# Patient Record
Sex: Female | Born: 1970 | Race: Black or African American | Hispanic: No | Marital: Married | Smoking: Never smoker
Health system: Southern US, Community
[De-identification: ages and names within clinical notes are randomized; demographics above are authoritative.]

## PROBLEM LIST (undated history)

## (undated) ENCOUNTER — Emergency Department (HOSPITAL_COMMUNITY): Admission: EM | Discharge status: Against Medical Advice | Payer: No Typology Code available for payment source

## (undated) DIAGNOSIS — Z789 Other specified health status: Secondary | ICD-10-CM

## (undated) DIAGNOSIS — J302 Other seasonal allergic rhinitis: Secondary | ICD-10-CM

## (undated) DIAGNOSIS — J189 Pneumonia, unspecified organism: Secondary | ICD-10-CM

## (undated) DIAGNOSIS — I10 Essential (primary) hypertension: Secondary | ICD-10-CM

## (undated) DIAGNOSIS — J329 Chronic sinusitis, unspecified: Secondary | ICD-10-CM

## (undated) HISTORY — PX: TUBAL LIGATION: SHX77

---

## 1997-07-23 ENCOUNTER — Other Ambulatory Visit: Admission: RE | Admit: 1997-07-23 | Discharge: 1997-07-23 | Payer: Self-pay | Admitting: Obstetrics and Gynecology

## 1999-04-11 ENCOUNTER — Emergency Department (HOSPITAL_COMMUNITY): Admission: EM | Admit: 1999-04-11 | Discharge: 1999-04-11 | Payer: Self-pay | Admitting: Emergency Medicine

## 1999-04-11 ENCOUNTER — Encounter: Payer: Self-pay | Admitting: Emergency Medicine

## 1999-06-29 ENCOUNTER — Emergency Department (HOSPITAL_COMMUNITY): Admission: EM | Admit: 1999-06-29 | Discharge: 1999-06-29 | Payer: Self-pay | Admitting: Emergency Medicine

## 1999-11-14 ENCOUNTER — Other Ambulatory Visit: Admission: RE | Admit: 1999-11-14 | Discharge: 1999-11-14 | Payer: Self-pay | Admitting: Obstetrics

## 1999-12-15 ENCOUNTER — Encounter (INDEPENDENT_AMBULATORY_CARE_PROVIDER_SITE_OTHER): Payer: Self-pay

## 1999-12-15 ENCOUNTER — Inpatient Hospital Stay (HOSPITAL_COMMUNITY): Admission: AD | Admit: 1999-12-15 | Discharge: 1999-12-18 | Payer: Self-pay | Admitting: Obstetrics

## 2004-08-23 ENCOUNTER — Emergency Department (HOSPITAL_COMMUNITY): Admission: EM | Admit: 2004-08-23 | Discharge: 2004-08-23 | Payer: Self-pay | Admitting: *Deleted

## 2008-11-10 ENCOUNTER — Emergency Department (HOSPITAL_COMMUNITY): Admission: EM | Admit: 2008-11-10 | Discharge: 2008-11-10 | Payer: Self-pay | Admitting: Emergency Medicine

## 2009-11-12 ENCOUNTER — Emergency Department (HOSPITAL_COMMUNITY): Admission: EM | Admit: 2009-11-12 | Discharge: 2009-11-12 | Payer: Self-pay | Admitting: Emergency Medicine

## 2010-01-18 ENCOUNTER — Ambulatory Visit (HOSPITAL_COMMUNITY): Admission: RE | Admit: 2010-01-18 | Discharge: 2010-01-18 | Payer: Self-pay | Admitting: Obstetrics

## 2010-01-24 ENCOUNTER — Encounter: Admission: RE | Admit: 2010-01-24 | Discharge: 2010-01-24 | Payer: Self-pay | Admitting: Obstetrics

## 2010-02-02 ENCOUNTER — Inpatient Hospital Stay (HOSPITAL_COMMUNITY): Admission: EM | Admit: 2010-02-02 | Discharge: 2010-02-05 | Payer: Self-pay | Admitting: Emergency Medicine

## 2010-02-02 DIAGNOSIS — Z789 Other specified health status: Secondary | ICD-10-CM

## 2010-02-02 HISTORY — DX: Other specified health status: Z78.9

## 2010-02-07 ENCOUNTER — Ambulatory Visit: Payer: Self-pay | Admitting: Family Medicine

## 2010-02-11 ENCOUNTER — Ambulatory Visit: Payer: Self-pay | Admitting: Family Medicine

## 2010-02-16 ENCOUNTER — Ambulatory Visit: Payer: Self-pay | Admitting: Family Medicine

## 2010-02-18 ENCOUNTER — Ambulatory Visit: Payer: Self-pay | Admitting: Family Medicine

## 2010-02-22 ENCOUNTER — Ambulatory Visit: Payer: Self-pay | Admitting: Family Medicine

## 2010-02-28 ENCOUNTER — Ambulatory Visit: Payer: Self-pay | Admitting: Family Medicine

## 2010-03-07 ENCOUNTER — Ambulatory Visit: Payer: Self-pay | Admitting: Family Medicine

## 2010-03-21 ENCOUNTER — Ambulatory Visit: Payer: Self-pay | Admitting: Family Medicine

## 2010-04-06 ENCOUNTER — Ambulatory Visit: Payer: Self-pay | Admitting: Family Medicine

## 2010-04-21 ENCOUNTER — Ambulatory Visit: Payer: Self-pay | Admitting: Family Medicine

## 2010-05-23 ENCOUNTER — Ambulatory Visit
Admission: RE | Admit: 2010-05-23 | Discharge: 2010-05-23 | Payer: Self-pay | Source: Home / Self Care | Attending: Family Medicine | Admitting: Family Medicine

## 2010-07-12 ENCOUNTER — Ambulatory Visit: Payer: Self-pay | Admitting: Gynecology

## 2010-07-14 LAB — HEPARIN LEVEL (UNFRACTIONATED)
Heparin Unfractionated: 0.3 IU/mL (ref 0.30–0.70)
Heparin Unfractionated: 0.33 IU/mL (ref 0.30–0.70)
Heparin Unfractionated: 0.45 IU/mL (ref 0.30–0.70)

## 2010-07-14 LAB — BASIC METABOLIC PANEL
Chloride: 105 mEq/L (ref 96–112)
Chloride: 106 mEq/L (ref 96–112)
Creatinine, Ser: 0.76 mg/dL (ref 0.4–1.2)
Creatinine, Ser: 0.81 mg/dL (ref 0.4–1.2)
GFR calc non Af Amer: >60 mL/min (ref >60)
Sodium: 136 mEq/L (ref 135–145)

## 2010-07-14 LAB — CBC
HCT: 26.8 % — ABNORMAL LOW (ref 36.0–46.0)
HCT: 29.2 % — ABNORMAL LOW (ref 36.0–46.0)
HCT: 29.8 % — ABNORMAL LOW (ref 36.0–46.0)
MCH: 27.7 pg (ref 26.0–34.0)
MCH: 28.2 pg (ref 26.0–34.0)
MCHC: 33.3 g/dL (ref 30.0–36.0)
MCV: 84.2 fL (ref 78.0–100.0)
MCV: 84.9 fL (ref 78.0–100.0)
MCV: 85.7 fL (ref 78.0–100.0)
Platelets: 152 10*3/uL (ref 150–400)
RBC: 3.47 MIL/uL — ABNORMAL LOW (ref 3.87–5.11)
RDW: 14.6 % (ref 11.5–15.5)
RDW: 15 % (ref 11.5–15.5)

## 2010-07-14 LAB — DIFFERENTIAL
Basophils Relative: 0 % (ref 0–1)
Eosinophils Absolute: 0 10*3/uL (ref 0.0–0.7)
Eosinophils Relative: 1 % (ref 0–5)
Lymphs Abs: 1.6 10*3/uL (ref 0.7–4.0)
Monocytes Relative: 7 % (ref 3–12)
Neutro Abs: 4.2 10*3/uL (ref 1.7–7.7)
Neutrophils Relative %: 67 % (ref 43–77)

## 2010-07-14 LAB — PROTIME-INR
INR: 1.08 (ref 0.00–1.49)
INR: 1.12 (ref 0.00–1.49)
INR: 1.27 (ref 0.00–1.49)
Prothrombin Time: 14.6 seconds (ref 11.6–15.2)
Prothrombin Time: 16.1 seconds — ABNORMAL HIGH (ref 11.6–15.2)
Prothrombin Time: 21.4 seconds — ABNORMAL HIGH (ref 11.6–15.2)

## 2010-07-14 LAB — APTT: aPTT: 62 seconds — ABNORMAL HIGH (ref 24–37)

## 2010-07-14 LAB — POCT I-STAT, CHEM 8
Calcium, Ion: 1.15 mmol/L (ref 1.12–1.32)
Creatinine, Ser: 0.8 mg/dL (ref 0.4–1.2)
Glucose, Bld: 81 mg/dL (ref 70–99)

## 2010-08-30 HISTORY — PX: ENDOMETRIAL ABLATION W/ NOVASURE: SUR434

## 2010-09-16 NOTE — Op Note (Signed)
[facility] [place]  Patient:    Monica Christensen, Monica Christensen                           MRN: [id number] Proc. Date: 12/15/99 Adm. Date:  [id number] Attending:  Venita Sheffield                           Operative Report  PREOPERATIVE DIAGNOSES:       Intrauterine pregnancy at term, previous cesarean section and patient desires repeat cesarean section and tubal ligation.  SURGEON:                      Kathreen Cosier, M.D.  FIRST ASSISTANT:              Deniece Ree, M.D.  ANESTHESIA:                   Spinal.  DESCRIPTION OF PROCEDURE:     The patient was placed on the operating table in a supine position.  Abdomen prepped and draped.  Bladder emptied with Foley catheter.  A transverse suprapubic incision made through the old scar and carried down to the rectus fascia.  The fascia was cleanly incised the length of the incision.  The rectus muscles were retracted laterally and peritoneum incised longitudinally.  A transverse incision made in the visceral peritoneum above the bladder and the bladder mobilized.  A transverse low uterine incision made.  The patient was delivered from the OP position of a female, Apgars 8 and 9, weighing 6 pounds 2 ounces.  The throat was cleared.  A team of attendants over.  Placenta was posterior and removed manually.  The uterine cavity cleaned with dry laps.  Uterine incision closed with interlocking suture of #1 chromic including myometrium and endometrium.  The bladder flap reattached with 2-0 chromic.  Hemostasis was satisfactory.  The right tube was grasped in mid portion with Babcock clamp and #0 plain suture placed in the mesosalpinx below the portion of the tube in the clamp.  This was tied on both sides and approximately one inch of tube transected.  Procedure done in a similar fashion on the other side.  Lap and sponge counts correct.  Abdomen closed in layers, peritoneum continuous with #2-0 chromic, fascia  continuous with #00 Dexon, and the skin closed with subcuticular stitch of 3-0 plain. The patient tolerated procedure well and was taken to recovery room in good condition. DD:  12/15/99 TD:  12/16/99 Job: [id number]

## 2010-09-16 NOTE — Discharge Summary (Signed)
[facility] [place]  Patient:    Monica Christensen, Monica Christensen                           MRN: [id number] Adm. Date:  [id number] Disc. Date: [id number] Attending:  Venita Sheffield                           Discharge Summary  HISTORY OF PRESENT ILLNESS:   The patient is a 40 year old gravida 3, para 0-2-0-2, [facility] December 18, 1999.  She had previous cesarean section and desired repeat at term.  She also desired sterilization.  HOSPITAL COURSE:              She underwent repeat low transverse cesarean section and delivered a 6 pound 12 ounce female, Apgars 8 and 9.  She also had tubal ligation performed.  Her hemoglobin was 10.5.  Postoperatively, her hemoglobin was 9.5.  She had brief period of temperature elevation of 101, and she was treated with Cleocin IV.  She rapidly defervesced and was discharged home on the third postoperative day.  DISCHARGE INSTRUCTIONS:       Regular diet.  Tylox one to two every three to four hours p.r.n. and Cleocin 300 mg p.o. q.6h. x 5 days.  DISCHARGE DIAGNOSES:          1. Status post repeat low transverse cesarean                                  section at term.                               2. Postpartum tubal ligation. DD:  12/18/99 TD:  12/19/99 Job: [id number]

## 2010-11-17 ENCOUNTER — Encounter (HOSPITAL_COMMUNITY): Payer: Self-pay | Admitting: *Deleted

## 2010-11-24 ENCOUNTER — Encounter: Payer: Self-pay | Admitting: Obstetrics and Gynecology

## 2010-11-24 ENCOUNTER — Encounter (HOSPITAL_COMMUNITY): Admission: RE | Payer: Self-pay | Source: Ambulatory Visit

## 2010-11-24 ENCOUNTER — Ambulatory Visit (HOSPITAL_COMMUNITY)
Admission: RE | Admit: 2010-11-24 | Payer: BC Managed Care – PPO | Source: Ambulatory Visit | Admitting: Obstetrics and Gynecology

## 2010-11-24 DIAGNOSIS — Z789 Other specified health status: Secondary | ICD-10-CM | POA: Insufficient documentation

## 2010-11-24 HISTORY — DX: Other specified health status: Z78.9

## 2010-11-24 SURGERY — DILATATION & CURETTAGE/HYSTEROSCOPY WITH THERMACHOICE ABLATION
Anesthesia: General

## 2010-11-24 MED ORDER — FENTANYL CITRATE 0.05 MG/ML IJ SOLN
INTRAMUSCULAR | Status: AC
  Filled 2010-11-24: qty 5

## 2010-11-24 MED ORDER — MIDAZOLAM HCL 2 MG/2ML IJ SOLN
INTRAMUSCULAR | Status: AC
  Filled 2010-11-24: qty 2

## 2010-12-27 ENCOUNTER — Emergency Department (HOSPITAL_COMMUNITY): Payer: BC Managed Care – PPO

## 2010-12-27 ENCOUNTER — Emergency Department (HOSPITAL_COMMUNITY)
Admission: EM | Admit: 2010-12-27 | Discharge: 2010-12-27 | Disposition: A | Discharge status: Home / Self Care | Payer: BC Managed Care – PPO | Attending: Emergency Medicine | Admitting: Emergency Medicine

## 2010-12-27 DIAGNOSIS — R0609 Other forms of dyspnea: Secondary | ICD-10-CM | POA: Insufficient documentation

## 2010-12-27 DIAGNOSIS — R0989 Other specified symptoms and signs involving the circulatory and respiratory systems: Secondary | ICD-10-CM | POA: Insufficient documentation

## 2010-12-27 DIAGNOSIS — M549 Dorsalgia, unspecified: Secondary | ICD-10-CM | POA: Insufficient documentation

## 2010-12-27 DIAGNOSIS — M79609 Pain in unspecified limb: Secondary | ICD-10-CM | POA: Insufficient documentation

## 2010-12-27 DIAGNOSIS — R071 Chest pain on breathing: Secondary | ICD-10-CM | POA: Insufficient documentation

## 2010-12-27 DIAGNOSIS — Z86711 Personal history of pulmonary embolism: Secondary | ICD-10-CM | POA: Insufficient documentation

## 2010-12-27 DIAGNOSIS — R0602 Shortness of breath: Secondary | ICD-10-CM | POA: Insufficient documentation

## 2010-12-27 DIAGNOSIS — M542 Cervicalgia: Secondary | ICD-10-CM | POA: Insufficient documentation

## 2010-12-27 LAB — POCT I-STAT TROPONIN I

## 2010-12-27 LAB — BASIC METABOLIC PANEL
CO2: 27 mEq/L (ref 19–32)
Calcium: 9.1 mg/dL (ref 8.4–10.5)
GFR calc Af Amer: >60 mL/min (ref >60)
GFR calc non Af Amer: >60 mL/min (ref >60)
Sodium: 137 mEq/L (ref 135–145)

## 2010-12-27 LAB — DIFFERENTIAL
Basophils Relative: 0 % (ref 0–1)
Eosinophils Absolute: 0.1 10*3/uL (ref 0.0–0.7)
Eosinophils Relative: 2 % (ref 0–5)
Monocytes Relative: 8 % (ref 3–12)
Neutrophils Relative %: 41 % — ABNORMAL LOW (ref 43–77)

## 2010-12-27 LAB — CBC
MCH: 27.9 pg (ref 26.0–34.0)
Platelets: 203 10*3/uL (ref 150–400)
RBC: 3.8 MIL/uL — ABNORMAL LOW (ref 3.87–5.11)
RDW: 14.5 % (ref 11.5–15.5)

## 2010-12-27 MED ORDER — IOHEXOL 300 MG/ML  SOLN
100.0000 mL | Freq: Once | INTRAMUSCULAR | Status: AC | PRN
  Administered 2010-12-27: 100 mL via INTRAVENOUS

## 2011-09-05 ENCOUNTER — Emergency Department (HOSPITAL_COMMUNITY)
Admission: EM | Admit: 2011-09-05 | Discharge: 2011-09-05 | Disposition: A | Discharge status: Home / Self Care | Payer: BC Managed Care – PPO | Attending: Emergency Medicine | Admitting: Emergency Medicine

## 2011-09-05 ENCOUNTER — Emergency Department (HOSPITAL_COMMUNITY): Payer: BC Managed Care – PPO

## 2011-09-05 ENCOUNTER — Encounter (HOSPITAL_COMMUNITY): Payer: Self-pay | Admitting: Emergency Medicine

## 2011-09-05 DIAGNOSIS — M7989 Other specified soft tissue disorders: Secondary | ICD-10-CM | POA: Insufficient documentation

## 2011-09-05 DIAGNOSIS — W2203XA Walked into furniture, initial encounter: Secondary | ICD-10-CM | POA: Insufficient documentation

## 2011-09-05 DIAGNOSIS — S93609A Unspecified sprain of unspecified foot, initial encounter: Secondary | ICD-10-CM | POA: Insufficient documentation

## 2011-09-05 DIAGNOSIS — Z86711 Personal history of pulmonary embolism: Secondary | ICD-10-CM | POA: Insufficient documentation

## 2011-09-05 DIAGNOSIS — S93505A Unspecified sprain of left lesser toe(s), initial encounter: Secondary | ICD-10-CM

## 2011-09-05 DIAGNOSIS — M79609 Pain in unspecified limb: Secondary | ICD-10-CM | POA: Insufficient documentation

## 2011-09-05 MED ORDER — HYDROCODONE-ACETAMINOPHEN 5-325 MG PO TABS: 1.0000 | ORAL_TABLET | Freq: Four times a day (QID) | ORAL | Status: AC | PRN

## 2011-09-05 MED ORDER — HYDROCODONE-ACETAMINOPHEN 5-325 MG PO TABS
1.0000 | ORAL_TABLET | Freq: Once | ORAL | Status: AC
  Administered 2011-09-05: 1 via ORAL
  Filled 2011-09-05: qty 1

## 2011-09-05 NOTE — ED Provider Notes (Signed)
History     CSN: [id number]  Arrival date & time 09/05/11  0151   First MD Initiated Contact with Patient 09/05/11 0319      Chief Complaint  Patient presents with  . Foot Injury    (Consider location/radiation/quality/duration/timing/severity/associated sxs/prior treatment) HPI Is a 41 year old black female who struck her left fifth toe against the corner of a piece of furniture yesterday evening. The toe was hyper abducted. She has subsequently developed the gradual onset of pain and swelling in that toe. The pain is moderate to severe, worse with ambulation or movement. She buddy taped her toes and an attempt to help the pain. She denies other injury  Past Medical History  Diagnosis Date  . pulmonary embolism     Past Surgical History  Procedure Date  . Cesarean section 2001, 1998  . Tubal ligation   . Endometrial ablation w/ novasure 08/2010    Family History  Problem Relation Age of Onset  . Cancer Mother   . Coronary artery disease Father     History  Substance Use Topics  . Smoking status: Never Smoker   . Smokeless tobacco: Not on file  . Alcohol Use: No    OB History    Grav Para Term Preterm Abortions TAB SAB Ect Mult Living   3 2 2  1  0 1 0 0 2      Review of Systems  All other systems reviewed and are negative.    Allergies  Penicillins and Percocet  Home Medications   Current Outpatient Rx  Name Route Sig Dispense Refill  . DIPHENHYDRAMINE HCL 25 MG PO TABS Oral Take 25 mg by mouth every 6 (six) hours as needed. Allergies      BP 115/78  Pulse 67  Temp(Src) 98.7 F (37.1 C) (Oral)  Resp 20  Wt 165 lb (74.844 kg)  SpO2 99%  Physical Exam General: Well-developed, well-nourished female in no acute distress; appearance consistent with age of record HENT: normocephalic, atraumatic Eyes: Normal appearance Neck: supple Heart: regular rate and rhythm Lungs: clear to auscultation bilaterally Abdomen: soft; nondistended; nontender; bowel  sounds present Extremities: No deformity; tenderness and decreased range of motion of left fifth toe; pulses normal; sensation intact and capillary refill brisk distal left fifth toe Neurologic: Awake, alert and oriented; motor function intact in all extremities and symmetric; no facial droop Skin: Warm and dry Psychiatric: Normal mood and affect    ED Course  Procedures (including critical care time)    MDM  Dg Toe 5th Left  09/05/2011  *RADIOLOGY REPORT*  Clinical Data: Injury to the left little toe on furniture. Associated pain.  DG TOE 5TH LEFT  Comparison: None.  Findings: There is no evidence of fracture or dislocation.  The fifth toe appears intact.  Visualized joint spaces are preserved. No significant soft tissue abnormalities are characterized on radiograph.  IMPRESSION: No evidence of fracture or dislocation.  Original Report Authenticated By: Tonia Ghent, M.D.          Hanley Seamen, MD 09/05/11 [id number]

## 2011-09-05 NOTE — ED Notes (Signed)
Pt states she kicked into the entertainment center yesterday and injured her pinky toe on the left foot  Pt states the pain has continued to get worse  Pt states it is swollen  Pt has an ace wrap on her foot in triage

## 2011-12-05 ENCOUNTER — Emergency Department (HOSPITAL_COMMUNITY)
Admission: EM | Admit: 2011-12-05 | Discharge: 2011-12-05 | Disposition: A | Discharge status: Home / Self Care | Payer: BC Managed Care – PPO | Source: Home / Self Care | Attending: Emergency Medicine | Admitting: Emergency Medicine

## 2011-12-05 ENCOUNTER — Encounter (HOSPITAL_COMMUNITY): Payer: Self-pay

## 2011-12-05 DIAGNOSIS — J309 Allergic rhinitis, unspecified: Secondary | ICD-10-CM

## 2011-12-05 HISTORY — DX: Other seasonal allergic rhinitis: J30.2

## 2011-12-05 HISTORY — DX: Chronic sinusitis, unspecified: J32.9

## 2011-12-05 MED ORDER — CETIRIZINE-PSEUDOEPHEDRINE ER 5-120 MG PO TB12: 1.0000 | ORAL_TABLET | Freq: Every day | ORAL | Status: DC

## 2011-12-05 MED ORDER — OLOPATADINE HCL 0.1 % OP SOLN: 1.0000 [drp] | Freq: Two times a day (BID) | OPHTHALMIC | Status: DC

## 2011-12-05 MED ORDER — AZELASTINE HCL 0.1 % NA SOLN: 1.0000 | Freq: Two times a day (BID) | NASAL | Status: DC

## 2011-12-05 NOTE — ED Provider Notes (Signed)
History     CSN: [id number]  Arrival date & time 12/05/11  1106   First MD Initiated Contact with Patient 12/05/11 1110      Chief Complaint  Patient presents with  . Facial Pain    (Consider location/radiation/quality/duration/timing/severity/associated sxs/prior treatment) HPI Comments: Patient with a history of seasonal allergies presents with itchy, watery eyes, nasal congestion, rhinorrhea, sneezing postnasal drip, irritated throat, coughing up "greenish stuff" in the morning starting 2 days ago. States the ears feel full. No fevers, nausea, vomiting. No chest pain, wheezing, shortness of breath. No abdominal pain, reflux symptoms. No aggravating factors. She is taking Benadryl without improvement. Patient has a history of sinus infections, and is concerned that she might have another one.  ROS as noted in HPI. All other ROS negative.   Patient is a 41 y.o. female presenting with URI. The history is provided by the patient. No language interpreter was used.  URI    Past Medical History  Diagnosis Date  . pulmonary embolism   . Sinusitis   . Seasonal allergies     Past Surgical History  Procedure Date  . Cesarean section 2001, 1998  . Tubal ligation   . Endometrial ablation w/ novasure 08/2010    Family History  Problem Relation Age of Onset  . Cancer Mother   . Coronary artery disease Father     History  Substance Use Topics  . Smoking status: Never Smoker   . Smokeless tobacco: Not on file  . Alcohol Use: No    OB History    Grav Para Term Preterm Abortions TAB SAB Ect Mult Living   3 2 2  1  0 1 0 0 2      Review of Systems  Allergies  Penicillins and Percocet  Home Medications   Current Outpatient Rx  Name Route Sig Dispense Refill  . AZELASTINE HCL 137 MCG/SPRAY NA SOLN Nasal Place 1 spray into the nose 2 (two) times daily. Use in each nostril as directed 30 mL 12  . CETIRIZINE-PSEUDOEPHEDRINE ER 5-120 MG PO TB12 Oral Take 1 tablet by mouth  daily. 14 tablet 0  . OLOPATADINE HCL 0.1 % OP SOLN Both Eyes Place 1 drop into both eyes 2 (two) times daily. 5 mL 0    BP 138/93  Pulse 70  Temp 99.4 F (37.4 C) (Oral)  Resp 18  SpO2 100%  Physical Exam  Nursing note and vitals reviewed. Constitutional: She appears well-developed and well-nourished.  HENT:  Head: Normocephalic and atraumatic.  Right Ear: Tympanic membrane and ear canal normal.  Left Ear: Tympanic membrane and ear canal normal.  Nose: Mucosal edema and rhinorrhea present. No epistaxis.  Mouth/Throat: Uvula is midline and mucous membranes are normal. Posterior oropharyngeal erythema present. No oropharyngeal exudate.       Cobblestoned oropharynx (-) frontal, maxillary sinus tenderness  Eyes: Conjunctivae and EOM are normal.  Neck: Normal range of motion. Neck supple.  Cardiovascular: Normal rate, regular rhythm and normal heart sounds.   Pulmonary/Chest: Effort normal and breath sounds normal.  Abdominal: She exhibits no distension.  Musculoskeletal: Normal range of motion.  Lymphadenopathy:    She has no cervical adenopathy.  Neurological: She is alert. Coordination normal.  Skin: Skin is warm and dry. No rash noted.  Psychiatric: She has a normal mood and affect. Her behavior is normal. Judgment and thought content normal.    ED Course  Procedures (including critical care time)  Labs Reviewed - No data to display No  results found.   1. Allergic sinusitis     MDM  No fevers >102, has had sx for < 10 days, no h/o double sickening. No historical or objective evidence of bacterial infection. No indication for abx. Appears to have a significant allergy component, will try Zyrtec-D, Astelin, saline nasal irrigation. Patanol for eye itching.  Discussed MDM and plan with pt. Pt agrees with plan and will f/u with PMD prn.     Luiz Blare, MD 12/05/11 1229

## 2011-12-05 NOTE — ED Notes (Signed)
C/o facial pain and pressure for past few days; concern for sinus infection; c/o no relief w OTC medications; NAD

## 2012-01-29 IMAGING — US US TRANSVAGINAL NON-OB
1 series · 14 of 25 positions shown · non-contrast
Comparison: None.

CLINICAL DATA: Cervical polyps.  LMP 12/30/2009.



[Series 1: us transvaginal non-ob · 14 of 47 slices shown]
[im 1/47]
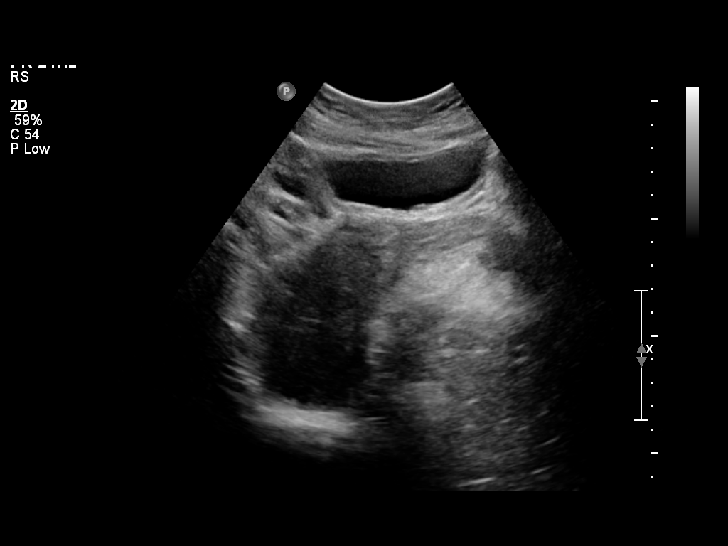
[im 4/47]
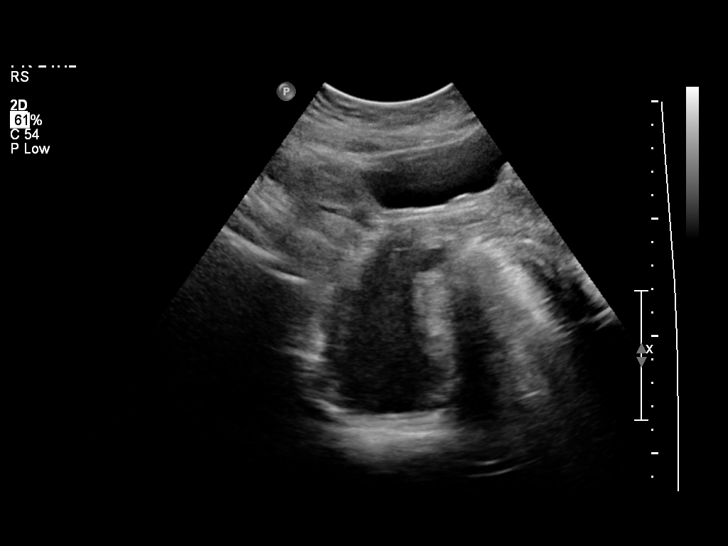
[im 8/47]
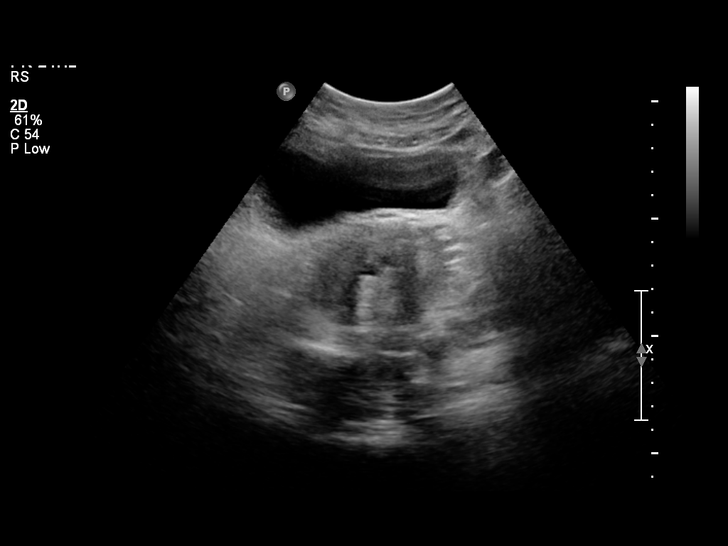
[im 12/47]
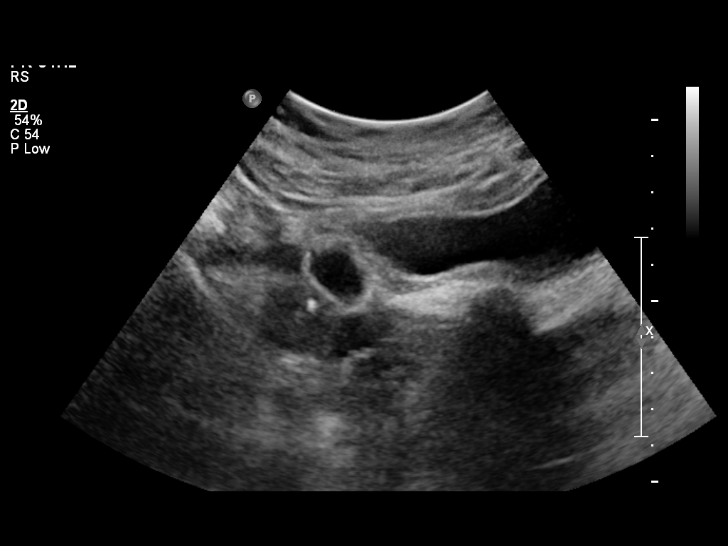
[im 16/47]
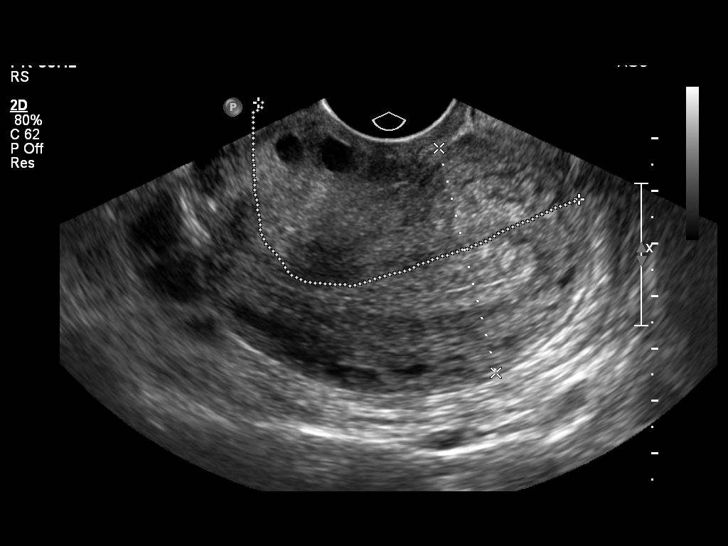
[im 18/47]
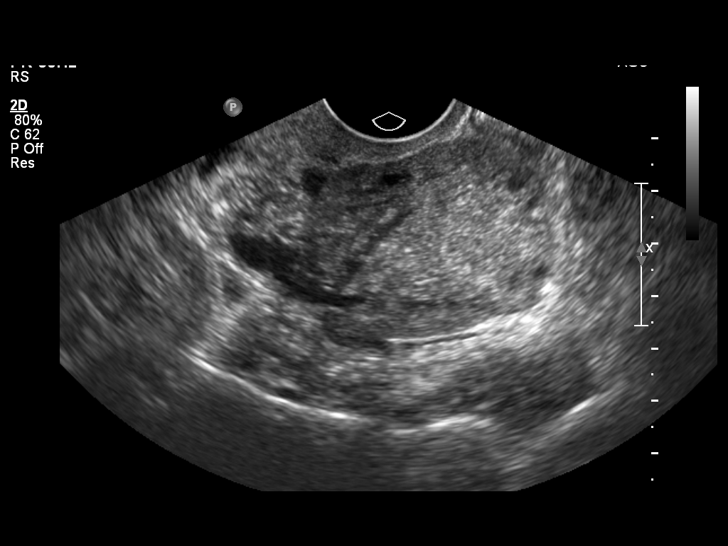
[im 22/47]
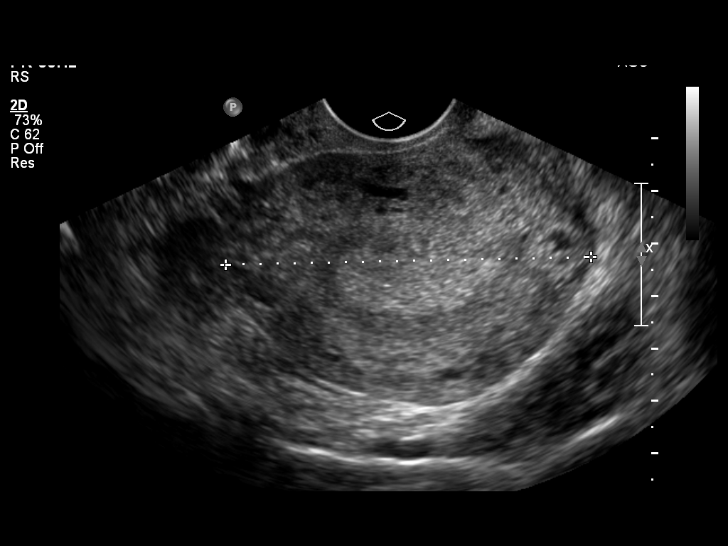
[im 25/47]
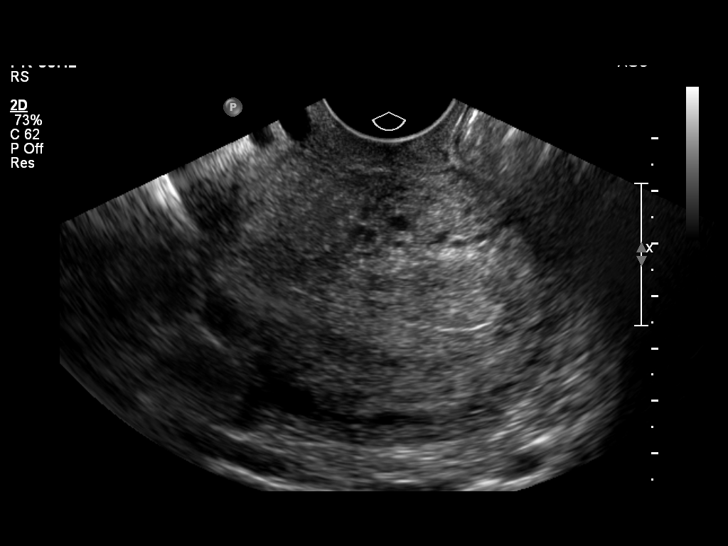
[im 29/47]
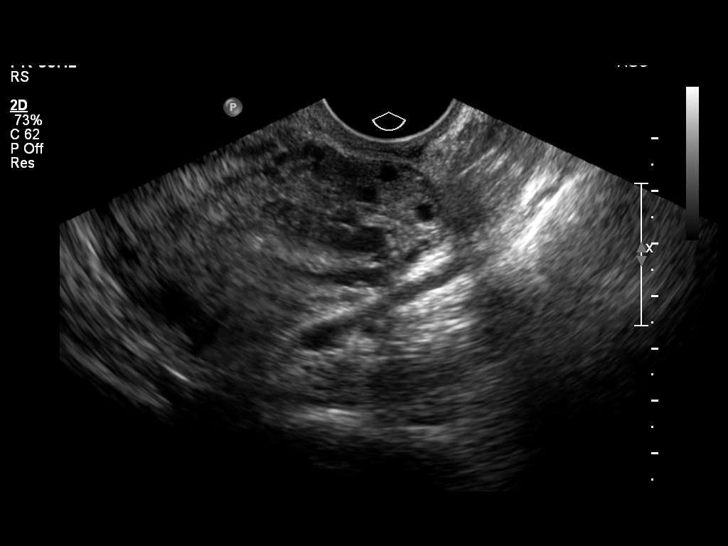
[im 31/47]
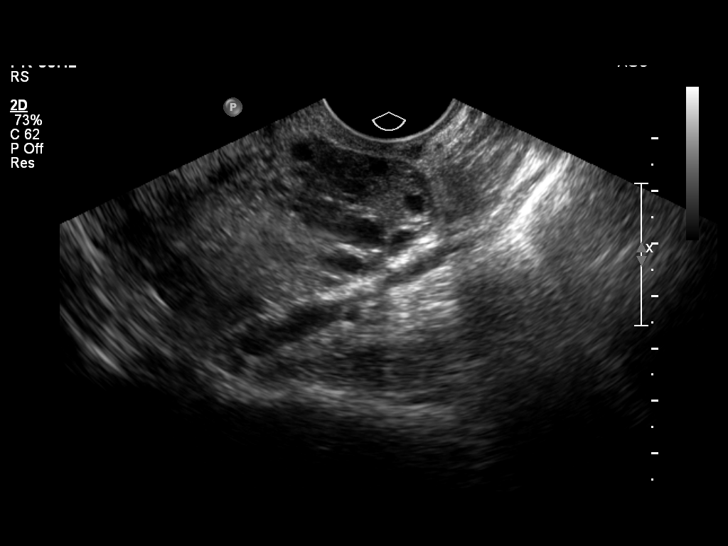
[im 35/47]
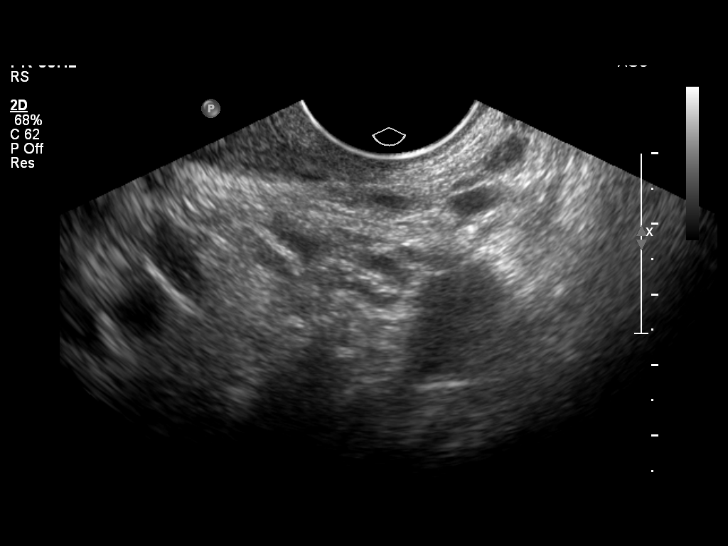
[im 39/47]
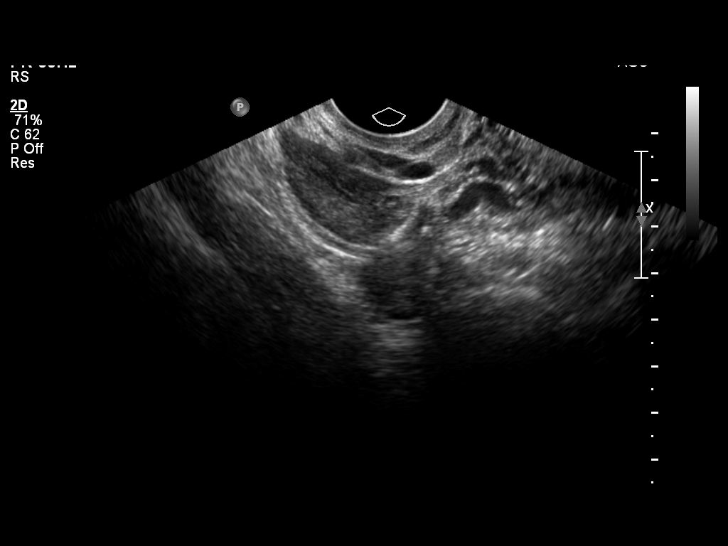
[im 43/47]
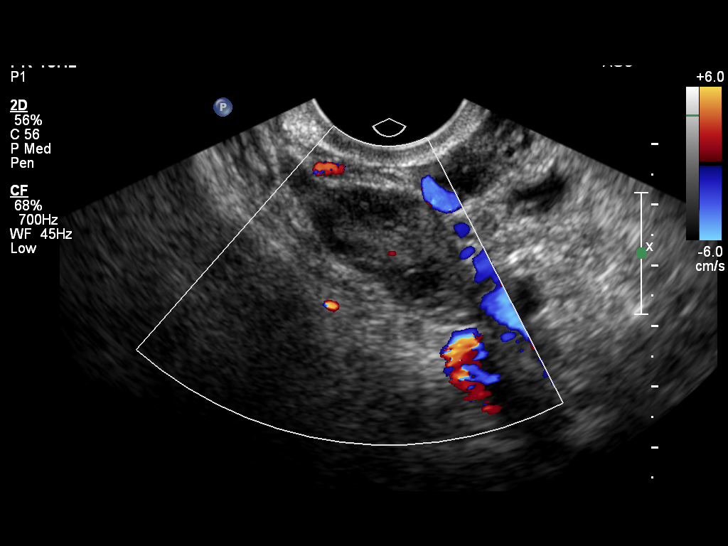
[im 47/47]
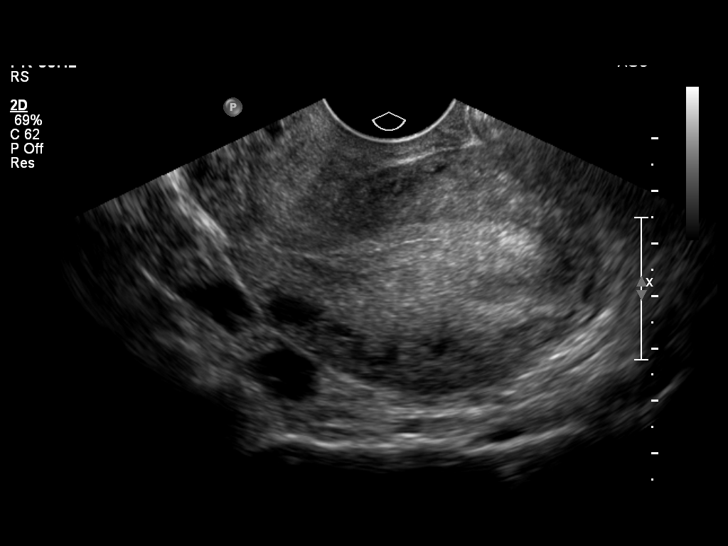

[14 of 25 positions shown; findings below may reference images not displayed]

FINDINGS: Uterus is retroverted and measures 5.5 cm length by 4.4 cm AP
diameter by 7.0 cm with.  Myometrium is homogeneous.  No focal
uterine mass identified.

Endometrium measures 1.2 cm maximum thickness and is homogeneous.

Right Ovary is normal in size and appearance.  Measures 3.2 x 1.5 x
2.0 cm

Left Ovary is normal in size and appearance.  Measures 3.3 x 1.9-
2.3 cm.

Other Findings:  No free pelvic fluid is identified.
IMPRESSION: Pelvic ultrasound within normal limits.  Retroverted uterus.

## 2012-02-01 ENCOUNTER — Emergency Department (HOSPITAL_COMMUNITY)
Admission: EM | Admit: 2012-02-01 | Discharge: 2012-02-01 | Disposition: A | Discharge status: Home / Self Care | Payer: BC Managed Care – PPO | Attending: Emergency Medicine | Admitting: Emergency Medicine

## 2012-02-01 ENCOUNTER — Encounter (HOSPITAL_COMMUNITY): Payer: Self-pay | Admitting: Emergency Medicine

## 2012-02-01 DIAGNOSIS — Z880 Allergy status to penicillin: Secondary | ICD-10-CM | POA: Insufficient documentation

## 2012-02-01 DIAGNOSIS — Z8249 Family history of ischemic heart disease and other diseases of the circulatory system: Secondary | ICD-10-CM | POA: Insufficient documentation

## 2012-02-01 DIAGNOSIS — Z809 Family history of malignant neoplasm, unspecified: Secondary | ICD-10-CM | POA: Insufficient documentation

## 2012-02-01 DIAGNOSIS — H109 Unspecified conjunctivitis: Secondary | ICD-10-CM | POA: Insufficient documentation

## 2012-02-01 DIAGNOSIS — Z885 Allergy status to narcotic agent status: Secondary | ICD-10-CM | POA: Insufficient documentation

## 2012-02-01 MED ORDER — OFLOXACIN 0.3 % OP SOLN
2.0000 [drp] | Freq: Four times a day (QID) | OPHTHALMIC | Status: DC
  Administered 2012-02-01: 2 [drp] via OPHTHALMIC
  Filled 2012-02-01: qty 5

## 2012-02-01 MED ORDER — PROPARACAINE HCL 0.5 % OP SOLN
1.0000 [drp] | Freq: Once | OPHTHALMIC | Status: AC
  Administered 2012-02-01: 1 [drp] via OPHTHALMIC
  Filled 2012-02-01: qty 15

## 2012-02-01 NOTE — ED Provider Notes (Signed)
Medical screening examination/treatment/procedure(s) were performed by non-physician practitioner and as supervising physician I was immediately available for consultation/collaboration.   Mads Borgmeyer, MD 02/01/12 1606 

## 2012-02-01 NOTE — ED Provider Notes (Signed)
History     CSN: [id number]  Arrival date & time 02/01/12  1309   First MD Initiated Contact with Patient 02/01/12 1317      Chief Complaint  Patient presents with  . Eye Pain    pain and redness in l/eye x 6 hrs    (Consider location/radiation/quality/duration/timing/severity/associated sxs/prior treatment) HPI Comments: Monica Christensen is a 41 y.o. Female who presents with complaint of left eye redness and itching. States she woke up this morning with left eye shut with white 'Gunk.' States hadn to wash it out and when it was opened noted it was red. States it itches, feels like something is in it, it is red. Denies photophobia. No injuries. No fever, chills, other uri symptoms. No visual changes. Pt does not wear contacts.   The history is provided by the patient.    Past Medical History  Diagnosis Date  . pulmonary embolism   . Sinusitis   . Seasonal allergies     Past Surgical History  Procedure Date  . Cesarean section 2001, 1998  . Tubal ligation   . Endometrial ablation w/ novasure 08/2010    Family History  Problem Relation Age of Onset  . Cancer Mother   . Hypertension Mother   . Coronary artery disease Father   . Hypertension Father     History  Substance Use Topics  . Smoking status: Never Smoker   . Smokeless tobacco: Not on file  . Alcohol Use: No    OB History    Grav Para Term Preterm Abortions TAB SAB Ect Mult Living   3 2 2  1  0 1 0 0 2      Review of Systems  Constitutional: Negative for fever and chills.  HENT: Negative for congestion, sore throat, rhinorrhea, sneezing and neck pain.   Eyes: Positive for discharge, redness and itching. Negative for photophobia and pain.  Respiratory: Negative.   Cardiovascular: Negative.   Gastrointestinal: Negative for nausea and vomiting.  Skin: Negative for rash.  Neurological: Negative for headaches.    Allergies  Penicillins and Percocet  Home Medications   Current Outpatient Rx  Name Route  Sig Dispense Refill  . CETIRIZINE-PSEUDOEPHEDRINE ER 5-120 MG PO TB12 Oral Take 1 tablet by mouth daily as needed. For allergies.      BP 125/90  Pulse 62  Temp 98.7 F (37.1 C)  Resp 16  Wt 175 lb (79.379 kg)  SpO2 100%  Physical Exam  Nursing note and vitals reviewed. Constitutional: She appears well-developed and well-nourished. No distress.  HENT:  Head: Normocephalic.  Right Ear: External ear normal.  Left Ear: External ear normal.  Nose: Nose normal.  Mouth/Throat: Oropharynx is clear and moist.  Eyes: Lids are normal. No foreign bodies found. Left eye exhibits no discharge, no exudate and no hordeolum. No foreign body present in the left eye. Left conjunctiva is injected. Left conjunctiva has no hemorrhage.  Slit lamp exam:      The left eye shows no corneal abrasion, no corneal flare, no corneal ulcer, no hyphema, no hypopyon and no fluorescein uptake.      ED Course  Procedures (including critical care time)  Pt with left eye redness, itching, drainage. No injury. No corneal abrasion. No visual changes. Suspect conjunctivitis. Pt had recent close contact with someone with conjunctivitis.   Vision L: 20/40, R: 20/35  1. Conjunctivitis of left eye       MDM  Lottie Mussel, PA 02/01/12 1536

## 2012-02-01 NOTE — ED Notes (Signed)
Pt reports pain and itching in l/eye x 6 hrs, drainage cloudy

## 2012-02-26 ENCOUNTER — Emergency Department (HOSPITAL_COMMUNITY)
Admission: EM | Admit: 2012-02-26 | Discharge: 2012-02-26 | Disposition: A | Discharge status: Home / Self Care | Payer: BC Managed Care – PPO | Attending: Emergency Medicine | Admitting: Emergency Medicine

## 2012-02-26 ENCOUNTER — Emergency Department (HOSPITAL_COMMUNITY): Payer: BC Managed Care – PPO

## 2012-02-26 ENCOUNTER — Encounter (HOSPITAL_COMMUNITY): Payer: Self-pay | Admitting: Emergency Medicine

## 2012-02-26 DIAGNOSIS — S61419A Laceration without foreign body of unspecified hand, initial encounter: Secondary | ICD-10-CM

## 2012-02-26 DIAGNOSIS — Z86711 Personal history of pulmonary embolism: Secondary | ICD-10-CM | POA: Insufficient documentation

## 2012-02-26 DIAGNOSIS — S61209A Unspecified open wound of unspecified finger without damage to nail, initial encounter: Secondary | ICD-10-CM | POA: Insufficient documentation

## 2012-02-26 DIAGNOSIS — J329 Chronic sinusitis, unspecified: Secondary | ICD-10-CM | POA: Insufficient documentation

## 2012-02-26 DIAGNOSIS — Y990 Civilian activity done for income or pay: Secondary | ICD-10-CM | POA: Insufficient documentation

## 2012-02-26 DIAGNOSIS — J301 Allergic rhinitis due to pollen: Secondary | ICD-10-CM | POA: Insufficient documentation

## 2012-02-26 DIAGNOSIS — W278XXA Contact with other nonpowered hand tool, initial encounter: Secondary | ICD-10-CM | POA: Insufficient documentation

## 2012-02-26 DIAGNOSIS — Y9269 Other specified industrial and construction area as the place of occurrence of the external cause: Secondary | ICD-10-CM | POA: Insufficient documentation

## 2012-02-26 DIAGNOSIS — Z23 Encounter for immunization: Secondary | ICD-10-CM | POA: Insufficient documentation

## 2012-02-26 MED ORDER — SULFAMETHOXAZOLE-TRIMETHOPRIM 800-160 MG PO TABS: 1.0000 | ORAL_TABLET | Freq: Two times a day (BID) | ORAL | Status: DC

## 2012-02-26 MED ORDER — HYDROCODONE-ACETAMINOPHEN 5-325 MG PO TABS
2.0000 | ORAL_TABLET | Freq: Once | ORAL | Status: AC
  Administered 2012-02-26: 2 via ORAL
  Filled 2012-02-26: qty 2

## 2012-02-26 MED ORDER — HYDROCODONE-ACETAMINOPHEN 5-325 MG PO TABS: 2.0000 | ORAL_TABLET | ORAL | Status: DC | PRN

## 2012-02-26 MED ORDER — TETANUS-DIPHTH-ACELL PERTUSSIS 5-2.5-18.5 LF-MCG/0.5 IM SUSP
0.5000 mL | Freq: Once | INTRAMUSCULAR | Status: AC
  Administered 2012-02-26: 0.5 mL via INTRAMUSCULAR
  Filled 2012-02-26: qty 0.5

## 2012-02-26 NOTE — ED Notes (Signed)
Pt verbalizes understanding 

## 2012-02-26 NOTE — ED Notes (Signed)
Pt states she cut her right hand on a pair of scissors approx 2 hours ago at work. Pt with very small laceration to hand at base of thumb with very minimal bleeding.

## 2012-02-26 NOTE — ED Provider Notes (Signed)
History  This chart was scribed for non-physician practitioner working with Donnetta Hutching, MD by Bennett Scrape. This patient was seen in room WTR6/WTR6 and the patient's care was started at 1429. CSN: [id number]  Arrival date & time 02/26/12  1318   First MD Initiated Contact with Patient 02/26/12 1429      Chief Complaint  Patient presents with  . Laceration    The history is provided by the patient. No language interpreter was used.    Monica Christensen is a 41 y.o. female who presents to the Emergency Department complaining of a laceration with associated pain and slight thumb weakness. She denies any other finger numbness and weakness. She states she was at work putting something together when she stabbed herself with a pair of scissors. She describes the pain as severe and radiating and the wound was bleeding heavily. She states that after the injury she wrapped the wound in a towel and ran it under cold water. She has a h/o PE and seasonal allergies. She denies smoking and alcohol use.     Past Medical History  Diagnosis Date  . pulmonary embolism   . Sinusitis   . Seasonal allergies     Past Surgical History  Procedure Date  . Cesarean section 2001, 1998  . Tubal ligation   . Endometrial ablation w/ novasure 08/2010    Family History  Problem Relation Age of Onset  . Cancer Mother   . Hypertension Mother   . Coronary artery disease Father   . Hypertension Father     History  Substance Use Topics  . Smoking status: Never Smoker   . Smokeless tobacco: Not on file  . Alcohol Use: No    OB History    Grav Para Term Preterm Abortions TAB SAB Ect Mult Living   3 2 2  1  0 1 0 0 2      Review of Systems  Skin: Positive for wound.  All other systems reviewed and are negative.    Allergies  Penicillins and Percocet  Home Medications   Current Outpatient Rx  Name Route Sig Dispense Refill  . ACETAMINOPHEN 500 MG PO TABS Oral Take 1,000 mg by mouth every 6 (six)  hours as needed.    Marland Kitchen CETIRIZINE-PSEUDOEPHEDRINE ER 5-120 MG PO TB12 Oral Take 1 tablet by mouth daily as needed. For allergies.      Triage Vitals: BP 141/104  Pulse 91  Temp 98.8 F (37.1 C) (Oral)  Resp 20  SpO2 100%  Physical Exam  Nursing note and vitals reviewed. Constitutional: She is oriented to person, place, and time. She appears well-developed and well-nourished. No distress.  HENT:  Head: Normocephalic and atraumatic.  Eyes: EOM are normal. Pupils are equal, round, and reactive to light.  Neck: Normal range of motion. Neck supple. No tracheal deviation present.  Cardiovascular: Normal rate.   Pulmonary/Chest: Effort normal. No respiratory distress.  Abdominal: Soft. She exhibits no distension.  Musculoskeletal: Normal range of motion. She exhibits tenderness. She exhibits no edema.       Strength and sensation equal and intact bilaterally in upper extremities  Neurological: She is alert and oriented to person, place, and time.  Skin: Skin is warm and dry.       2 cm laceration to finger web between thumb and first digit, bruising and tenderness to thenar area of left hand, no obvious deformities   Psychiatric: She has a normal mood and affect. Her behavior is normal.  ED Course  Procedures (including critical care time)  DIAGNOSTIC STUDIES: Oxygen Saturation is 100% on room air, normal by my interpretation.    COORDINATION OF CARE:  2:45 PM: Discussed treatment plan which includes a tetanus shot, hand X-ray and pain medication with pt at bedside and pt agreed to plan.  3:00 PM: Medication Orders- HYDROcodone-acetaminophen (NORCO/VICODIN) 5-325 MG per tablet 2 tablet Once, TDaP (BOOSTRIX) injection 0.5 mL Once       Labs Reviewed - No data to display Dg Hand Complete Right  02/26/2012  *RADIOLOGY REPORT*  Clinical Data: Stabbed herself with scissors, pain.  RIGHT HAND - COMPLETE 3+ VIEW  Comparison: None.  Findings: There is no evidence of fracture or  dislocation.  There is no evidence of arthropathy or other focal bony abnormality. Soft tissues are unremarkable.  IMPRESSION: No fracture or radiopaque foreign body.   Original Report Authenticated By: Elsie Stain, M.D.      1. Laceration of hand       MDM  Xray negative for any acute abnormalities. Patient will be discharged with bactrim, norco and follow up with hand surgery. Patient is agreeable to plan. No evidence of neurovascular compromise on exam.     This chart was written by a scribe in my presence. I have reviewed and agree with the note.   MSE was initiated and I personally evaluated the patient and placed orders (if any) at There are other unrelated non-urgent complaints, but due to the busy schedule and the amount of time I've already spent with her, time does not permit me to address these routine issues at today's visit. I've requested another appointment to review these additional issues. on @date @.   The patient appears stable so that the remainder of the MSE may be completed by another provider.   Emilia Beck, PA-C 02/26/12 1702

## 2012-02-27 NOTE — ED Provider Notes (Signed)
Medical screening examination/treatment/procedure(s) were performed by non-physician practitioner and as supervising physician I was immediately available for consultation/collaboration.  Donnetta Hutching, MD 02/27/12 1524

## 2012-05-10 ENCOUNTER — Emergency Department (HOSPITAL_COMMUNITY)
Admission: EM | Admit: 2012-05-10 | Discharge: 2012-05-10 | Disposition: A | Discharge status: Home / Self Care | Payer: BC Managed Care – PPO | Attending: Emergency Medicine | Admitting: Emergency Medicine

## 2012-05-10 ENCOUNTER — Emergency Department (HOSPITAL_COMMUNITY): Payer: BC Managed Care – PPO

## 2012-05-10 ENCOUNTER — Encounter (HOSPITAL_COMMUNITY): Payer: Self-pay | Admitting: *Deleted

## 2012-05-10 DIAGNOSIS — Z9889 Other specified postprocedural states: Secondary | ICD-10-CM | POA: Insufficient documentation

## 2012-05-10 DIAGNOSIS — S0990XA Unspecified injury of head, initial encounter: Secondary | ICD-10-CM | POA: Insufficient documentation

## 2012-05-10 DIAGNOSIS — Z79899 Other long term (current) drug therapy: Secondary | ICD-10-CM | POA: Insufficient documentation

## 2012-05-10 DIAGNOSIS — IMO0002 Reserved for concepts with insufficient information to code with codable children: Secondary | ICD-10-CM | POA: Insufficient documentation

## 2012-05-10 DIAGNOSIS — S0993XA Unspecified injury of face, initial encounter: Secondary | ICD-10-CM | POA: Insufficient documentation

## 2012-05-10 DIAGNOSIS — Y9389 Activity, other specified: Secondary | ICD-10-CM | POA: Insufficient documentation

## 2012-05-10 DIAGNOSIS — Y92410 Unspecified street and highway as the place of occurrence of the external cause: Secondary | ICD-10-CM | POA: Insufficient documentation

## 2012-05-10 DIAGNOSIS — Z86711 Personal history of pulmonary embolism: Secondary | ICD-10-CM | POA: Insufficient documentation

## 2012-05-10 MED ORDER — HYDROCODONE-ACETAMINOPHEN 5-325 MG PO TABS: 2.0000 | ORAL_TABLET | Freq: Four times a day (QID) | ORAL | Status: DC | PRN

## 2012-05-10 MED ORDER — NAPROXEN 500 MG PO TABS: 500.0000 mg | ORAL_TABLET | Freq: Two times a day (BID) | ORAL | Status: DC

## 2012-05-10 MED ORDER — DIAZEPAM 5 MG PO TABS: 5.0000 mg | ORAL_TABLET | Freq: Two times a day (BID) | ORAL | Status: DC
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
## 2012-05-17 NOTE — ED Provider Notes (Signed)
Medical screening examination/treatment/procedure(s) were performed by non-physician practitioner and as supervising physician I was immediately available for consultation/collaboration.   Hurman Horn, MD 05/17/12 2133

## 2012-12-01 ENCOUNTER — Telehealth (HOSPITAL_COMMUNITY): Payer: Self-pay | Admitting: Emergency Medicine

## 2012-12-01 ENCOUNTER — Emergency Department (HOSPITAL_COMMUNITY)
Admission: EM | Admit: 2012-12-01 | Discharge: 2012-12-01 | Disposition: A | Discharge status: Home / Self Care | Payer: Worker's Compensation | Attending: Emergency Medicine | Admitting: Emergency Medicine

## 2012-12-01 ENCOUNTER — Emergency Department (HOSPITAL_COMMUNITY): Payer: Worker's Compensation

## 2012-12-01 ENCOUNTER — Encounter (HOSPITAL_COMMUNITY): Payer: Self-pay | Admitting: *Deleted

## 2012-12-01 DIAGNOSIS — Y929 Unspecified place or not applicable: Secondary | ICD-10-CM | POA: Insufficient documentation

## 2012-12-01 DIAGNOSIS — Z880 Allergy status to penicillin: Secondary | ICD-10-CM | POA: Insufficient documentation

## 2012-12-01 DIAGNOSIS — Z79899 Other long term (current) drug therapy: Secondary | ICD-10-CM | POA: Insufficient documentation

## 2012-12-01 DIAGNOSIS — Y990 Civilian activity done for income or pay: Secondary | ICD-10-CM | POA: Insufficient documentation

## 2012-12-01 DIAGNOSIS — T148XXA Other injury of unspecified body region, initial encounter: Secondary | ICD-10-CM | POA: Insufficient documentation

## 2012-12-01 DIAGNOSIS — S99929A Unspecified injury of unspecified foot, initial encounter: Secondary | ICD-10-CM | POA: Insufficient documentation

## 2012-12-01 DIAGNOSIS — W010XXA Fall on same level from slipping, tripping and stumbling without subsequent striking against object, initial encounter: Secondary | ICD-10-CM | POA: Insufficient documentation

## 2012-12-01 DIAGNOSIS — S8990XA Unspecified injury of unspecified lower leg, initial encounter: Secondary | ICD-10-CM | POA: Insufficient documentation

## 2012-12-01 DIAGNOSIS — Y939 Activity, unspecified: Secondary | ICD-10-CM | POA: Insufficient documentation

## 2012-12-01 DIAGNOSIS — S46909A Unspecified injury of unspecified muscle, fascia and tendon at shoulder and upper arm level, unspecified arm, initial encounter: Secondary | ICD-10-CM | POA: Insufficient documentation

## 2012-12-01 DIAGNOSIS — S4980XA Other specified injuries of shoulder and upper arm, unspecified arm, initial encounter: Secondary | ICD-10-CM | POA: Insufficient documentation

## 2012-12-01 DIAGNOSIS — Z86711 Personal history of pulmonary embolism: Secondary | ICD-10-CM | POA: Insufficient documentation

## 2012-12-01 MED ORDER — HYDROCODONE-ACETAMINOPHEN 5-325 MG PO TABS: 2.0000 | ORAL_TABLET | Freq: Four times a day (QID) | ORAL | Status: DC | PRN

## 2012-12-01 MED ORDER — PROMETHAZINE HCL 25 MG PO TABS: 25.0000 mg | ORAL_TABLET | Freq: Four times a day (QID) | ORAL | Status: DC | PRN

## 2012-12-01 MED ORDER — DIAZEPAM 5 MG PO TABS: 5.0000 mg | ORAL_TABLET | Freq: Two times a day (BID) | ORAL | Status: DC

## 2012-12-01 NOTE — ED Provider Notes (Signed)
CSN: [id number]     Arrival date & time 12/01/12  0904 History     First MD Initiated Contact with Patient 12/01/12 [phone]     Chief Complaint  Patient presents with  . Fall  . Back Pain  . Leg Pain    right  . Shoulder Pain    right   (Consider location/radiation/quality/duration/timing/severity/associated sxs/prior Treatment) HPI Comments: Patient is a 42 year old female who presents today after slipping and falling at work yesterday. She states she slipped on a wet floor and landed on her buttocks. Initially she felt "twinges of pain". She has been ambulatory since the event. There was no loss of consciousness, disorientation. She has tried both ice and heat on her back with no relief. She describes the pain as a burning sensation in her right shoulder and right low back. The pain radiates into her right leg. Walking makes the pain worse. Nothing seems to make the pain better. No bowel or bladder incontinence, history of cancer, IV drug abuse, fevers, chills, nausea, vomiting, abdominal pain, numbness, weakness, paresthesias.  The history is provided by the patient. No language interpreter was used.    Past Medical History  Diagnosis Date  . pulmonary embolism   . Sinusitis   . Seasonal allergies    Past Surgical History  Procedure Laterality Date  . Cesarean section  2001, 1998  . Tubal ligation    . Endometrial ablation w/ novasure  08/2010   Family History  Problem Relation Age of Onset  . Cancer Mother   . Hypertension Mother   . Coronary artery disease Father   . Hypertension Father    History  Substance Use Topics  . Smoking status: Never Smoker   . Smokeless tobacco: Never Used  . Alcohol Use: No   OB History   Grav Para Term Preterm Abortions TAB SAB Ect Mult Living   3 2 2  1  0 1 0 0 2     Review of Systems  Constitutional: Negative for fever and chills.  Gastrointestinal: Negative for nausea, vomiting and abdominal pain.  Musculoskeletal: Positive for  myalgias, back pain, joint swelling and arthralgias.  All other systems reviewed and are negative.    Allergies  Penicillins and Percocet  Home Medications   Current Outpatient Rx  Name  Route  Sig  Dispense  Refill  . acetaminophen (TYLENOL) 500 MG tablet   Oral   Take 1,000 mg by mouth every 6 (six) hours as needed. pain         . diazepam (VALIUM) 5 MG tablet   Oral   Take 1 tablet (5 mg total) by mouth 2 (two) times daily.   10 tablet   0   . HYDROcodone-acetaminophen (NORCO/VICODIN) 5-325 MG per tablet   Oral   Take 2 tablets by mouth every 6 (six) hours as needed for pain.   10 tablet   0   . naproxen (NAPROSYN) 500 MG tablet   Oral   Take 1 tablet (500 mg total) by mouth 2 (two) times daily.   30 tablet   0    BP 133/95  Pulse 62  Temp(Src) 98.1 F (36.7 C) (Oral)  Resp 18  Ht 5\' 6"  (1.676 m)  Wt 181 lb (82.101 kg)  BMI 29.23 kg/m2  SpO2 99% Physical Exam  Nursing note and vitals reviewed. Constitutional: She is oriented to person, place, and time. She appears well-developed and well-nourished. No distress.  HENT:  Head: Normocephalic and atraumatic.  Right Ear: External ear normal.  Left Ear: External ear normal.  Nose: Nose normal.  Mouth/Throat: Oropharynx is clear and moist.  Eyes: Conjunctivae are normal.  Neck: Normal range of motion and phonation normal. Tracheal tenderness, spinous process tenderness and muscular tenderness present.    Cardiovascular: Normal rate, regular rhythm, normal heart sounds, intact distal pulses and normal pulses.   Pulmonary/Chest: Effort normal and breath sounds normal. No stridor. No respiratory distress. She has no wheezes. She has no rales.  Abdominal: Soft. She exhibits no distension.  Musculoskeletal: Normal range of motion.       Right knee: No tenderness found.       Lumbar back: She exhibits tenderness, bony tenderness, pain and spasm. She exhibits no deformity.       Back:  Neurological: She is  alert and oriented to person, place, and time. She has normal strength. No sensory deficit. Coordination normal.  Skin: Skin is warm and dry. She is not diaphoretic. No erythema.  Psychiatric: She has a normal mood and affect. Her behavior is normal.    ED Course   Procedures (including critical care time)  Labs Reviewed - No data to display Dg Cervical Spine Complete  12/01/2012   *RADIOLOGY REPORT*  Clinical Data: Fall, neck pain  CERVICAL SPINE - COMPLETE 4+ VIEW  Comparison: Prior cervical spine radiographs 05/10/2012; concurrently obtained radiographs of the lumbosacral spine.  Findings: Frontal, lateral and bilateral oblique radiographs of the cervical spine demonstrate no acute fracture, malalignment or prevertebral soft tissue swelling.  Mild straightening of the normal cervical lordosis is unchanged compared to prior.  No significant degenerative changes noted.  No foraminal narrowing on the oblique radiographs.  Visualized lung apices within normal limits.  IMPRESSION: No acute radiographic abnormality of the cervical spine.   Original Report Authenticated By: Malachy Moan, M.D.   Dg Lumbar Spine Complete  12/01/2012   *RADIOLOGY REPORT*  Clinical Data: Fall, back pain  LUMBAR SPINE - COMPLETE 4+ VIEW  Comparison: None.  Findings: Five lumbar-type vertebral bodies.  Normal lumbar lordosis.  No evidence of fracture or dislocation.  Vertebral body heights and intervertebral disc spaces are maintained.  Mild degenerative changes involving the superior endplate of T12.  Visualized bony pelvis appears intact.  IMPRESSION: No fracture or dislocation is seen.  Very mild degenerative changes at T11-12.   Original Report Authenticated By: Charline Bills, M.D.   1. Muscle strain     MDM  Patient with back pain.  No neurological deficits and normal neuro exam.  Patient can walk but states is painful.  No loss of bowel or bladder control.  No concern for cauda equina.  No fever, night sweats,  weight loss, h/o cancer, IVDU.  RICE protocol and pain medicine indicated and discussed with patient.    Mora Bellman, PA-C 12/01/12 1110

## 2012-12-01 NOTE — ED Notes (Signed)
Pt states she slipped and fell on water yesterday at work, states fell on bottom and back on R side, was fine until last night started having R shoulder pain, lower back pain radiating down R leg.

## 2012-12-01 NOTE — ED Notes (Signed)
Patient transported to X-ray 

## 2012-12-01 NOTE — ED Notes (Signed)
Patient has pain in neck area and lower back upon palpation.

## 2012-12-02 NOTE — ED Provider Notes (Signed)
Medical screening examination/treatment/procedure(s) were performed by non-physician practitioner and as supervising physician I was immediately available for consultation/collaboration.  Jacobi Ryant, MD 12/02/12 1111 

## 2013-01-01 ENCOUNTER — Encounter (HOSPITAL_COMMUNITY): Payer: Self-pay | Admitting: Emergency Medicine

## 2013-01-01 ENCOUNTER — Emergency Department (HOSPITAL_COMMUNITY)
Admission: EM | Admit: 2013-01-01 | Discharge: 2013-01-01 | Disposition: A | Discharge status: Home / Self Care | Payer: Worker's Compensation | Attending: Emergency Medicine | Admitting: Emergency Medicine

## 2013-01-01 DIAGNOSIS — H538 Other visual disturbances: Secondary | ICD-10-CM | POA: Insufficient documentation

## 2013-01-01 DIAGNOSIS — IMO0002 Reserved for concepts with insufficient information to code with codable children: Secondary | ICD-10-CM | POA: Insufficient documentation

## 2013-01-01 DIAGNOSIS — R110 Nausea: Secondary | ICD-10-CM | POA: Insufficient documentation

## 2013-01-01 DIAGNOSIS — R42 Dizziness and giddiness: Secondary | ICD-10-CM | POA: Insufficient documentation

## 2013-01-01 DIAGNOSIS — M5410 Radiculopathy, site unspecified: Secondary | ICD-10-CM

## 2013-01-01 DIAGNOSIS — Z8709 Personal history of other diseases of the respiratory system: Secondary | ICD-10-CM | POA: Insufficient documentation

## 2013-01-01 DIAGNOSIS — Z880 Allergy status to penicillin: Secondary | ICD-10-CM | POA: Insufficient documentation

## 2013-01-01 DIAGNOSIS — Z86711 Personal history of pulmonary embolism: Secondary | ICD-10-CM | POA: Insufficient documentation

## 2013-01-01 DIAGNOSIS — Z79899 Other long term (current) drug therapy: Secondary | ICD-10-CM | POA: Insufficient documentation

## 2013-01-01 DIAGNOSIS — M549 Dorsalgia, unspecified: Secondary | ICD-10-CM | POA: Insufficient documentation

## 2013-01-01 DIAGNOSIS — M542 Cervicalgia: Secondary | ICD-10-CM | POA: Insufficient documentation

## 2013-01-01 LAB — URINALYSIS, ROUTINE W REFLEX MICROSCOPIC
Bilirubin Urine: NEGATIVE
Glucose, UA: NEGATIVE mg/dL
Ketones, ur: NEGATIVE mg/dL
Leukocytes, UA: NEGATIVE
Protein, ur: NEGATIVE mg/dL
pH: 6.5 (ref 5.0–8.0)

## 2013-01-01 MED ORDER — DIAZEPAM 5 MG PO TABS
5.0000 mg | ORAL_TABLET | Freq: Two times a day (BID) | ORAL | Status: DC
Start: 2013-01-01 — End: 2022-04-05

## 2013-01-01 MED ORDER — KETOROLAC TROMETHAMINE 60 MG/2ML IM SOLN
60.0000 mg | Freq: Once | INTRAMUSCULAR | Status: AC
  Administered 2013-01-01: 60 mg via INTRAMUSCULAR
  Filled 2013-01-01: qty 2

## 2013-01-01 MED ORDER — HYDROCODONE-ACETAMINOPHEN 5-325 MG PO TABS
2.0000 | ORAL_TABLET | Freq: Once | ORAL | Status: DC
  Filled 2013-01-01: qty 2

## 2013-01-01 MED ORDER — HYDROCODONE-ACETAMINOPHEN 5-325 MG PO TABS
2.0000 | ORAL_TABLET | Freq: Once | ORAL | Status: AC
  Administered 2013-01-01: 2 via ORAL

## 2013-01-01 MED ORDER — PROMETHAZINE HCL 25 MG RE SUPP: 25.0000 mg | Freq: Four times a day (QID) | RECTAL | Status: DC | PRN

## 2013-01-01 MED ORDER — HYDROCODONE-ACETAMINOPHEN 5-325 MG PO TABS: 1.0000 | ORAL_TABLET | Freq: Three times a day (TID) | ORAL | Status: DC | PRN

## 2013-01-01 MED ORDER — DIAZEPAM 5 MG PO TABS
5.0000 mg | ORAL_TABLET | Freq: Once | ORAL | Status: DC
  Filled 2013-01-01: qty 1

## 2013-01-01 MED ORDER — DIAZEPAM 5 MG/ML IJ SOLN
10.0000 mg | Freq: Once | INTRAMUSCULAR | Status: AC
  Administered 2013-01-01: 10 mg via INTRAMUSCULAR
  Filled 2013-01-01: qty 2

## 2013-01-01 NOTE — ED Notes (Signed)
Pt reports falling at work on 11/30/12; which she was seen at [facility] on 12/01/12. Pt was referred to physical therapy and to [facility]. Pt states she was suppose to be scheduled for an MRI, however has been unable to have that appointment scheduled. Pt reports dizziness, nausea, blurred vision, seeing black spots intermittently, back pain that radiates down to her right foot to her toes. Pt is A/O x4 and in NAD.

## 2013-01-01 NOTE — ED Provider Notes (Signed)
CSN: [id number]     Arrival date & time 01/01/13  1235 History   First MD Initiated Contact with Patient 01/01/13 1307     Chief Complaint  Patient presents with  . Dizziness  . Back Pain   (Consider location/radiation/quality/duration/timing/severity/associated sxs/prior Treatment) The history is provided by the patient. No language interpreter was used.  Monica Christensen he is a 42 year old female with past medical history of sinusitis and pulmonary embolism presenting to the emergency department with back pain with associated dizziness. Patient reported that she had a fall while at work approximately one month ago, reported that she was putting towels back to the washer and did not realize that the refrigerator door was opened near the portal, patient reported that she slipped on the portal and landed directly on her back-denied loss of consciousness or head injury. Patient reported that the discomfort is localized to the mid center of her back radiating all the way down to her lower back. She reported the pain is described as a constant sharp shooting pain with intermittent muscle spasms. Patient reported that the pain runs down her right leg resulting in intermittent episodes of numbness and tingling to the toes and foot - patient reported that this discomfort has been ongoing since her fall. Patient reported that she is starting to experience discomfort and pain radiating to the left hip that started yesterday. 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 Reported that he, laying down and ice make the pain better. Patient reported that she noticed mildly blurred vision, spotty vision has been ongoing since the fall-reported that she does wear glasses. Reported that she's been experiencing neck discomfort as well. Stated that she has been feeling  mildly nauseous has been ongoing since a fall. Patient reported that she was seen a [facility], stated that an MRI was recommended by the orthopedic, reported that she is due to get up called this week regarding a MRI appointment. Denied loss of consciousness, amnesia, sudden loss of vision, saddle paresthesias, vomiting, diarrhea, abdominal pain, melena, hematochezia, urinary symptoms, chest pain, shortness of breath, difficulty breathing, urinary and bowel incontinence. PCP none  Past Medical History  Diagnosis Date  . pulmonary embolism   . Sinusitis   . Seasonal allergies    Past Surgical History  Procedure Laterality Date  . Cesarean section  2001, 1998  . Tubal ligation    . Endometrial ablation w/ novasure  08/2010   Family History  Problem Relation Age of Onset  . Cancer Mother   . Hypertension Mother   . Coronary artery disease Father   . Hypertension Father    History  Substance Use Topics  . Smoking status: Never Smoker   . Smokeless tobacco: Never Used  . Alcohol Use: No   OB History   Grav Para Term Preterm Abortions TAB SAB Ect Mult Living   3 2 2  1  0 1 0 0 2     Review of Systems  Constitutional: Negative for fever and chills.  HENT: Positive for neck pain. Negative for sore throat and trouble swallowing.   Eyes: Positive for visual disturbance (mild blurred vision, patient wears glasses).  Respiratory: Negative for chest tightness and shortness of breath.   Cardiovascular: Negative for chest pain.  Gastrointestinal: Positive for nausea. Negative for vomiting, abdominal pain, diarrhea and constipation.  Genitourinary: Negative for dysuria and decreased urine volume.  Neurological: Negative for dizziness, weakness and numbness.  All other systems reviewed and are negative.    Allergies  Penicillins and Percocet  Home Medications   Current Outpatient Rx  Name  Route  Sig  Dispense  Refill  . acetaminophen (TYLENOL) 500 MG tablet   Oral    Take 1,000 mg by mouth every 6 (six) hours as needed for pain.          . diazepam (VALIUM) 5 MG tablet   Oral   Take 1 tablet (5 mg total) by mouth 2 (two) times daily.   10 tablet   0   . HYDROcodone-acetaminophen (NORCO/VICODIN) 5-325 MG per tablet   Oral   Take 1 tablet by mouth every 8 (eight) hours as needed for pain.   11 tablet   0   . promethazine (PHENERGAN) 25 MG suppository   Rectal   Place 1 suppository (25 mg total) rectally every 6 (six) hours as needed for nausea.   12 each   0    BP 157/100  Pulse 78  Temp(Src) 98.4 F (36.9 C) (Oral)  Resp 18  SpO2 98%  LMP 12/18/2012 Physical Exam  Nursing note and vitals reviewed. Constitutional: She is oriented to person, place, and time. She appears well-developed and well-nourished. No distress.  HENT:  Head: Normocephalic and atraumatic.  Mouth/Throat: Oropharynx is clear and moist. No oropharyngeal exudate.  Negative facial trauma Negative pain upon palpation to the maxillary, frontal and mandibular regions  Eyes: Conjunctivae and EOM are normal. Pupils are equal, round, and reactive to light. Right eye exhibits no discharge. Left eye exhibits no discharge.  Peripheral fields intact, negative peripheral field defects noted.  Negative nystagmus  Neck: Normal range of motion. Neck supple. No tracheal deviation present.  Negative neck stiffness Negative nuchal rigidity Mild discomfort upon palpation to the cervical spine, muscular nature  Cardiovascular: Normal rate, regular rhythm and normal heart sounds.  Exam reveals no friction rub.   No murmur heard. Pulses:      Radial pulses are 2+ on the right side, and 2+ on the left side.       Dorsalis pedis pulses are 2+ on the right side, and 2+ on the left side.  Pulmonary/Chest: Effort normal and breath sounds normal. No respiratory distress. She has no wheezes. She has no rales.  Musculoskeletal: Normal range of motion. She exhibits tenderness.        Back:  Tenderness upon palpation to the mid-cervical spine tenderness upon palpation  Tenderness upon palpation to the mid-thoracic region, lumbosacral region, coccyx - mid-spinal as well as paraspinal region  Discomfort on straight leg raise noted-right more so than left Full range of motion to upper extremities bilaterally Full range of motion to lower extremities bilaterally, discomfort noted upon flexion of the knees bilaterally right more so than left.  Lymphadenopathy:    She has no cervical adenopathy.  Neurological: She is alert and oriented to person, place, and time. No cranial nerve deficit. She exhibits normal muscle tone. Coordination normal.  Cranial nerves III-XII grossly intact  Sensation intact to upper and lower extremities bilaterally with differentiation to sharp and dull touch  Strength 5+/5+ with resistance to upper and lower extremities bilaterally, equally distributed Negative saddle anesthesia bilaterally  Skin: Skin is warm and dry. No rash noted. She is not diaphoretic. No erythema.  Psychiatric: She has a normal mood and affect. Her behavior is normal. Thought content  normal.    ED Course  Procedures (including critical care time)  Patient's chart was reviewed by this provider. Patient was seen on 12/06/2012 at [facility] after incident occurred where she fell while at work on 11/30/2012. Plain films were obtained of the cervical and lumbar spine-negative findings of the cervical spine, mild degenerative changes noted T11-T12 on the lumbar spine. Negative acute abnormalities identified. Patient was discharged with pain medication, muscle ask her, antinausea medication and referred to orthopedics.  Patient seen and assessed by Dr. Abran Duke - as per physician, did not recommend emergency tumor I., but did recommend that patient get an MRI at some point in time. Recommended that patient's pain be controlled in ED setting. Cleared patient for discharge with pain  medications and muscle relaxer with close followup with orthopedics.  Medications  HYDROcodone-acetaminophen (NORCO/VICODIN) 5-325 MG per tablet 2 tablet (2 tablets Oral Not Given 01/01/13 1727)  diazepam (VALIUM) injection 10 mg (10 mg Intramuscular Given 01/01/13 1443)  ketorolac (TORADOL) injection 60 mg (60 mg Intramuscular Given 01/01/13 1443)  HYDROcodone-acetaminophen (NORCO/VICODIN) 5-325 MG per tablet 2 tablet (2 tablets Oral Given 01/01/13 1726)    Labs Review Labs Reviewed  URINALYSIS, ROUTINE W REFLEX MICROSCOPIC - Abnormal; Notable for the following:    APPearance CLOUDY (*)    All other components within normal limits   Imaging Review No results found.  MDM   1. Back pain   2. Radiculopathy     Patient presenting to the emergency department with ongoing back pain, numbness, dizziness that has been ongoing since patient had a fall approximately one month ago while at work. Patient reported that Set of mildly blurred vision has been ongoing, reported that she does wear glasses. Alert and oriented. Discomfort upon palpation to the mid spinal region of the midthoracic, lumbar, sacral and coccyx. Discomfort upon palpation to this paraspinal region of the thoracic, lumbar, sacral regions. Discomfort noted with straight leg raise bilaterally. Strength intact and equally just related to upper and lower extremities. Full range of motion to upper and lower tremors bilaterally-discomfort upon flexion and extension of bilateral knees noted. Sensation intact. Pulses palpable. Negative saddle paresthesias identified. Pain controlled in ED setting. Patient stable, afebrile. Patient seen and assessed by Dr. Kathrene Bongo per physician, did not recommend emergent MRI to be performed today, but MRI will need to be performed in the near future - as per physician cleared patient for discharge. Discharge patient with pain medication, muscle ask her, antibiotics. Discussed course, precautions, disposal  technique. Referred patient to [facility] orthopedic, discussed with patient that if she is not to get a phone call regarding MRI appointment tomorrow she is to call the office again. Discussed and stressed the importance of getting a MRI to noted the definitive etiology of the back pain. Discussed with patient to rest and stay hydrated. Discussed with patient to apply heat and icy hot ointment to massage. Work note given regarding strict precautions and what the patient can and cannot do while at work. Discussed with patient to continue to monitor symptoms and if symptoms are to worsen or change report back to emergency department-strict return instructions given. Patient agreed to plan of care, understood, all questions answered.   Raymon Mutton, PA-C 01/01/13 1907  Medical screening examination/treatment/procedure(s) were conducted as a shared visit with non-physician practitioner(s) or resident and myself. I personally evaluated the patient during the encounter and agree with the findings and plan unless otherwise indicated.  Back pain since fall at work.  Pt is having worsening of her back pain, no acute change, worse with movement. Mild radiation down right leg intermittent, similar to previous. No b/b changes. No fevers. No weakness. Pt has had mild blurry vision in her eyes, she wears glasses, no double vision, no new ha, no neuro hx.  Exam 5+ strength in UE and LE with f/e at major joints.  Sensation to palpation intact in UE and LE.  CNs 2-12 grossly intact. EOMFI. PERRL.  Finger nose and coordination intact bilateral.  Visual fields intact to finger testing.  Pain with straight leg raise on right, back pain.  Pt has fup for MRI, rec pain control and pcp fup/ PT.  Pt comfortable with plan. No indication for emergent MRI at this time.  Note to limit lifting/ work for one week.    Enid Skeens, MD 01/02/13 [phone]

## 2013-01-03 ENCOUNTER — Emergency Department (HOSPITAL_COMMUNITY)
Admission: EM | Admit: 2013-01-03 | Discharge: 2013-01-04 | Disposition: A | Discharge status: Home / Self Care | Payer: BC Managed Care – PPO | Attending: Emergency Medicine | Admitting: Emergency Medicine

## 2013-01-03 ENCOUNTER — Encounter (HOSPITAL_COMMUNITY): Payer: Self-pay

## 2013-01-03 DIAGNOSIS — W010XXA Fall on same level from slipping, tripping and stumbling without subsequent striking against object, initial encounter: Secondary | ICD-10-CM | POA: Insufficient documentation

## 2013-01-03 DIAGNOSIS — M542 Cervicalgia: Secondary | ICD-10-CM | POA: Insufficient documentation

## 2013-01-03 DIAGNOSIS — M549 Dorsalgia, unspecified: Secondary | ICD-10-CM

## 2013-01-03 DIAGNOSIS — H538 Other visual disturbances: Secondary | ICD-10-CM | POA: Insufficient documentation

## 2013-01-03 DIAGNOSIS — R209 Unspecified disturbances of skin sensation: Secondary | ICD-10-CM | POA: Insufficient documentation

## 2013-01-03 DIAGNOSIS — IMO0002 Reserved for concepts with insufficient information to code with codable children: Secondary | ICD-10-CM | POA: Insufficient documentation

## 2013-01-03 DIAGNOSIS — Y9389 Activity, other specified: Secondary | ICD-10-CM | POA: Insufficient documentation

## 2013-01-03 DIAGNOSIS — Z86711 Personal history of pulmonary embolism: Secondary | ICD-10-CM | POA: Insufficient documentation

## 2013-01-03 DIAGNOSIS — S0990XA Unspecified injury of head, initial encounter: Secondary | ICD-10-CM | POA: Insufficient documentation

## 2013-01-03 DIAGNOSIS — Y9289 Other specified places as the place of occurrence of the external cause: Secondary | ICD-10-CM | POA: Insufficient documentation

## 2013-01-03 DIAGNOSIS — Y990 Civilian activity done for income or pay: Secondary | ICD-10-CM | POA: Insufficient documentation

## 2013-01-03 MED ORDER — DIAZEPAM 5 MG/ML IJ SOLN: 10.0000 mg | Freq: Once | INTRAMUSCULAR | Status: DC

## 2013-01-03 MED ORDER — KETOROLAC TROMETHAMINE 60 MG/2ML IM SOLN
60.0000 mg | Freq: Once | INTRAMUSCULAR | Status: AC
  Administered 2013-01-03: 60 mg via INTRAMUSCULAR
  Filled 2013-01-03: qty 2

## 2013-01-03 MED ORDER — DIAZEPAM 5 MG/ML IJ SOLN
10.0000 mg | Freq: Once | INTRAMUSCULAR | Status: AC
  Administered 2013-01-03: 10 mg via INTRAMUSCULAR
  Filled 2013-01-03: qty 2

## 2013-01-03 NOTE — ED Notes (Signed)
Dr. Jacubowitz at bedside 

## 2013-01-03 NOTE — ED Provider Notes (Signed)
CSN: [id number]     Arrival date & time 01/03/13  1839 History   First MD Initiated Contact with Patient 01/03/13 2154     Chief Complaint  Patient presents with  . Back Pain   (Consider location/radiation/quality/duration/timing/severity/associated sxs/prior Treatment) The history is provided by the patient. No language interpreter was used.  Monica Christensen is a 42 year old female with past medical history sinusitis and pulmonary bullous and presenting to emergency department with ongoing back pain since the incident of falling while at work on 11/30/2012. Patient reports that the back pain is localized to midthoracic and lower sacral region described discomfort as a sharp shooting pain with intermittent spasms. Patient reported that the pain radiates down the entire right leg with pain radiating down to the left hip and mid left thigh. Patient reports she's been experiencing numbness and tingling to bilateral feet. Patient reports that she's been having mild blurred vision localized to the left eye, reported that she's been seeing spots. Patient reports that her last episode of blurred vision and seeing spots in her left eye happened approximately 4 hours ago lasting approximately a couple minutes. 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 Patient reports that motion makes the pain worse while pain medications make the pain better. Patient reported that the Valium, Vicodin and Phenergan are dulling down the pain. Patient has MRI scheduled for next Friday. Patient denied chest pain, shortness of breath, difficulty breathing, difficulty swallowing, nausea, vomiting, urinary and bowel incontinence or dysuria, blood in the urine, abdominal pain. PCP none  Past Medical History  Diagnosis Date  . pulmonary embolism   . Sinusitis   . Seasonal allergies    Past Surgical History  Procedure Laterality Date  . Cesarean section  2001, 1998  . Tubal ligation    .  Endometrial ablation w/ novasure  08/2010   Family History  Problem Relation Age of Onset  . Cancer Mother   . Hypertension Mother   . Coronary artery disease Father   . Hypertension Father    History  Substance Use Topics  . Smoking status: Never Smoker   . Smokeless tobacco: Never Used  . Alcohol Use: No   OB History   Grav Para Term Preterm Abortions TAB SAB Ect Mult Living   3 2 2  1  0 1 0 0 2     Review of Systems  HENT: Positive for neck pain. Negative for trouble swallowing.   Eyes: Positive for visual disturbance.  Gastrointestinal: Negative for nausea, vomiting, abdominal pain and diarrhea.  Musculoskeletal: Positive for back pain and arthralgias.  Neurological: Positive for numbness and headaches. Negative for dizziness and weakness.  All other systems reviewed and are negative.    Allergies  Penicillins and Percocet  Home Medications   Current Outpatient Rx  Name  Route  Sig  Dispense  Refill  . acetaminophen (TYLENOL) 500 MG tablet   Oral   Take 1,000 mg by mouth every 6 (six) hours as needed for pain.          . diazepam (VALIUM) 5 MG tablet   Oral   Take 1 tablet (5 mg total) by mouth 2 (two) times daily.   10 tablet   0   . HYDROcodone-acetaminophen (NORCO/VICODIN) 5-325 MG per tablet   Oral   Take 1 tablet by mouth every 8 (eight) hours as needed for pain.   11 tablet   0   . methylPREDNISolone (MEDROL) 4  MG tablet   Oral   Take 4 mg by mouth daily.         . promethazine (PHENERGAN) 25 MG suppository   Rectal   Place 1 suppository (25 mg total) rectally every 6 (six) hours as needed for nausea.   12 each   0    BP 160/98  Pulse 64  Temp(Src) 98.5 F (36.9 C) (Oral)  Resp 16  Wt 185 lb (83.915 kg)  BMI 29.87 kg/m2  SpO2 98%  LMP 12/18/2012 Physical Exam  Nursing note and vitals reviewed. Constitutional: She is oriented to person, place, and time. She appears well-developed and well-nourished. No distress.  Patient found  laying flat in bed  HENT:  Head: Normocephalic and atraumatic.  Full range of motion to jaw, negative trismus identified  Eyes: Conjunctivae and EOM are normal. Pupils are equal, round, and reactive to light. Right eye exhibits no discharge. Left eye exhibits no discharge.  Negative nystagmus  Neck: Normal range of motion. Neck supple. No tracheal deviation present.    Discomfort upon palpation to lateral aspects of the neck, muscular in nature  Cardiovascular: Normal rate, regular rhythm and normal heart sounds.  Exam reveals no friction rub.   No murmur heard. Pulses:      Radial pulses are 2+ on the right side, and 2+ on the left side.       Dorsalis pedis pulses are 2+ on the right side, and 2+ on the left side.  Pulmonary/Chest: Effort normal and breath sounds normal. No respiratory distress. She has no wheezes. She has no rales.  Musculoskeletal: She exhibits tenderness.       Lumbar back: She exhibits decreased range of motion (Secondary to pain) and tenderness. She exhibits no swelling, no edema, no deformity, no laceration and no pain.       Back:  Decreased flexion and extension of the back secondary to pain. Pain with rotation of over-shoulder bilaterally secondary to pain localized in the neck and in the lower back. Decreased range of motion to lower extremities bilaterally secondary to pain.  Neurological: She is alert and oriented to person, place, and time. No cranial nerve deficit. She exhibits normal muscle tone. Coordination normal.  Cranial nerves III through XII grossly intact Strength decreased to lower extremities secondary to pain. Strength 5+/5+ to upper extremities bilaterally, with resistance, equal distribution  Skin: Skin is warm and dry. No rash noted. She is not diaphoretic. No erythema.  Psychiatric: She has a normal mood and affect. Her behavior is normal. Thought content normal.    ED Course  Procedures (including critical care time)  10:20 PM Patient  seen and assessed by Dr. Alden Server - denied any significance in blurred vision. Recommended patient's CBG to be checked and blood pressure to be checked manually. Did not recommend any imaging at this time.  Labs Review Labs Reviewed  GLUCOSE, CAPILLARY   Imaging Review No results found.  MDM   1. Back pain     Patient presenting to emergency department with back pain that has been ongoing since 11/30/2012 when patient fell on the floor while at work. Patient is been seen at least 3 times to the emergency department regarding back pain has been continuous. Patient reported that the back pain is localized to the mid thoracic, lumbar region with radiation down bilateral legs mainly the right leg is to worsen the left. Patient reported mild numbness sensation. Reported that she was seeing black spots earlier today. Alert and  oriented. Pain upon palpation to the thoracic, lumbar region upon palpation-the spinal and paraspinal region. Pain upon palpation to neck lateral aspects muscular nature. Decreased range of motion to lower tremors bilaterally secondary to pain, more so on the right than on the left. Sensation intact. Strength intact. Pulses palpable. CBG within normal limits. Patient seen and assessed by Dr. Rennis Chris. Recommended CBG to be measured as well as blood pressure to be measured manually. Patient will be given pain medications via IM route. Pain controlled in ED setting. Patient seen and assessed by Dr. Alden Server, cleared patient for discharge, did not recommend imaging at this time. Doubt cauda equina syndrome. Suspicion of back pain secondary to trauma. Patient currently has pain medications at home. Patient reported that she is due to go back to work tomorrow, requesting work note. Patient stable, afebrile. Discharged patient with instructions to continue pain medications as prescribed. Discussed with patient to keep appointment for MRI to be performed this coming Friday -  01/10/2013. Discussed with patient to refrain from any physical or strenuous activity. Discussed with patient to rest and stay hydrated. Discussed with patient to closely monitor symptoms and if symptoms are to worsen or change report back to emergency department immediately - return structures given. Patient agreed to plan of care, understood, all questions answered.    [employer], PA-C 01/05/13 0140

## 2013-01-03 NOTE — ED Provider Notes (Signed)
Presents with back pain radiating to both legs since she fell landing on her back on wet floor at work on August 2. She denies striking her head. She also admits to lightheadedness and intermittent blurred vision in her left eye. None presently. Exam alert Glasgow Coma Score 15 HEENT exam was 5 atraumatic eyes pupils equal react to light extraocular muscles intact neurologic gait normal Romberg normal prior drift normal cranial nerves II through XII grossly intact [place] doubt TIA in this young female with atypical symptoms and no risk factors  Doug Sou, MD 01/03/13 2233

## 2013-01-03 NOTE — ED Notes (Signed)
Lower back pain since August 2nd when pt. Larey Seat,  Has a MRI schedule for next Friday  The pain radiates down into her rt. Leg and foot.  Cause the foot to go numb.  On Vicodin, Valium and Prednisone and pt. Reports still having pain.  Elevated BP which she believes is causing her headaches, seeing black spots in her eyes intermittent began 2 days ago and the dizziness is intermittent

## 2013-01-03 NOTE — ED Notes (Signed)
Pt states she fell about a month ago at work and has since been having some lower back pain, headaches, and elevated b/p. Pt states she has not been diagnosed with HTN. Pt states she has occasional sharp pains that shoot down her leg and slight blurriness to left eye. Pt states she has been taking valium and flexeril for symptoms. Pt states the medication eases her pain but the pain does not completely go away. Pt rates pain 8-9/10.

## 2013-01-03 NOTE — ED Notes (Signed)
Pt ambulated to room with no problem.

## 2013-01-06 NOTE — ED Provider Notes (Signed)
Medical screening examination/treatment/procedure(s) were conducted as a shared visit with non-physician practitioner(s) and myself.  I personally evaluated the patient during the encounter  Doug Sou, MD 01/06/13 0003

## 2014-01-30 ENCOUNTER — Emergency Department (HOSPITAL_COMMUNITY)
Admission: EM | Admit: 2014-01-30 | Discharge: 2014-01-30 | Disposition: A | Discharge status: Home / Self Care | Payer: BC Managed Care – PPO | Attending: Emergency Medicine | Admitting: Emergency Medicine

## 2014-01-30 ENCOUNTER — Encounter (HOSPITAL_COMMUNITY): Payer: Self-pay | Admitting: Emergency Medicine

## 2014-01-30 ENCOUNTER — Emergency Department (HOSPITAL_COMMUNITY): Payer: BC Managed Care – PPO

## 2014-01-30 DIAGNOSIS — Z79899 Other long term (current) drug therapy: Secondary | ICD-10-CM | POA: Diagnosis not present

## 2014-01-30 DIAGNOSIS — M546 Pain in thoracic spine: Secondary | ICD-10-CM | POA: Insufficient documentation

## 2014-01-30 DIAGNOSIS — Z880 Allergy status to penicillin: Secondary | ICD-10-CM | POA: Insufficient documentation

## 2014-01-30 DIAGNOSIS — Z7952 Long term (current) use of systemic steroids: Secondary | ICD-10-CM | POA: Insufficient documentation

## 2014-01-30 DIAGNOSIS — Z8709 Personal history of other diseases of the respiratory system: Secondary | ICD-10-CM | POA: Diagnosis not present

## 2014-01-30 DIAGNOSIS — Z86711 Personal history of pulmonary embolism: Secondary | ICD-10-CM | POA: Insufficient documentation

## 2014-01-30 DIAGNOSIS — R51 Headache: Secondary | ICD-10-CM | POA: Insufficient documentation

## 2014-01-30 DIAGNOSIS — R509 Fever, unspecified: Secondary | ICD-10-CM | POA: Insufficient documentation

## 2014-01-30 MED ORDER — METHOCARBAMOL 500 MG PO TABS: 1000.0000 mg | ORAL_TABLET | Freq: Four times a day (QID) | ORAL | Status: DC

## 2014-01-30 MED ORDER — NAPROXEN 500 MG PO TABS: 500.0000 mg | ORAL_TABLET | Freq: Two times a day (BID) | ORAL | Status: DC

## 2014-01-30 MED ORDER — TRAMADOL HCL 50 MG PO TABS: 50.0000 mg | ORAL_TABLET | Freq: Four times a day (QID) | ORAL | Status: DC | PRN

## 2014-01-30 NOTE — Discharge Instructions (Signed)
Please read and follow all provided instructions.  Your diagnoses today include:  1. Left-sided thoracic back pain     Tests performed today include:  Vital signs - see below for your results today  Chest x-ray - no signs of pneumonia or other problems  Medications prescribed:   Robaxin (methocarbamol) - muscle relaxer medication  DO NOT drive or perform any activities that require you to be awake and alert because this medicine can make you drowsy.    Naproxen - anti-inflammatory pain medication  Do not exceed 500mg  naproxen every 12 hours, take with food  You have been prescribed an anti-inflammatory medication or NSAID. Take with food. Take smallest effective dose for the shortest duration needed for your pain. Stop taking if you experience stomach pain or vomiting.    Tramadol - narcotic-like pain medication  DO NOT drive or perform any activities that require you to be awake and alert because this medicine can make you drowsy.   Take any prescribed medications only as directed.  Home care instructions:   Follow any educational materials contained in this packet  Please rest, use ice or heat on your back for the next several days  Do not lift, push, pull anything more than 10 pounds for the next week  Follow-up instructions: Please follow-up with your primary care provider in the next 1 week for further evaluation of your symptoms.   Return instructions:  SEEK IMMEDIATE MEDICAL ATTENTION IF YOU HAVE:  New numbness, tingling, weakness, or problem with the use of your arms or legs  Severe back pain not relieved with medications  Loss control of your bowels or bladder  Increasing pain in any areas of the body (such as chest or abdominal pain)  Shortness of breath, dizziness, or fainting.   Worsening nausea (feeling sick to your stomach), vomiting, fever, or sweats  Any other emergent concerns regarding your health   Additional Information:  Your vital  signs today were: BP 133/93   Pulse 61   Temp(Src) 98.7 F (37.1 C) (Oral)   Resp 18   Wt 181 lb (82.101 kg)   SpO2 100%   LMP 01/13/2014 If your blood pressure (BP) was elevated above 135/85 this visit, please have this repeated by your doctor within one month. --------------

## 2014-01-30 NOTE — ED Provider Notes (Signed)
CSN: [id number]     Arrival date & time 01/30/14  1313 History   First MD Initiated Contact with Patient 01/30/14 1321    This chart was scribed for non-physician practitioner,Josh Worthington, PA, working with Audree Camel, MD by Marica Otter, ED Scribe. This patient was seen in room WTR6/WTR6 and the patient's care was started at 1:21 PM.   Chief Complaint  Patient presents with  . Shoulder Pain    pain under l/shoulder blade x 1 week  . Back Pain   HPI PCP: No PCP Per Patient HPI Comments: Monica Christensen is a 43 y.o. female, with medical Hx noted below and significant for pulmonary embolism (one episode a few years ago resulting from birth control use, pt no longer in Tx for PE and no longer on birth control), who presents to the Emergency Department complaining of atraumatic, acute, constant, sharp, non-radiating pain below the left shoulder blade onset one week ago. Pt describes her pain as a stabbing pain and rates it a 7 out of 10. Pt reports the pain is not worsened with deep breaths but is worse with movement and twisting. Pt reports a Hx of similar pain, however, historically the pain did not persist for this length of time. Pain today is different than previous PE -- which she describes as a sharp pain in throat with swallowing and pressure in chest. Pt also complains of associated intermittent fever (102F) and HA onset three days ago. Pt reports taking tylenol at home and heating the affected area without relief. Pt denies any pain below the right shoulder blade, SOB, cough, n/v/d, abd pain, recent travels, any other cold like Sx, or tobacco use.   Past Medical History  Diagnosis Date  . pulmonary embolism   . Sinusitis   . Seasonal allergies    Past Surgical History  Procedure Laterality Date  . Cesarean section  2001, 1998  . Tubal ligation    . Endometrial ablation w/ novasure  08/2010   Family History  Problem Relation Age of Onset  . Cancer Mother   . Hypertension Mother    . Coronary artery disease Father   . Hypertension Father    History  Substance Use Topics  . Smoking status: Never Smoker   . Smokeless tobacco: Never Used  . Alcohol Use: No   OB History   Grav Para Term Preterm Abortions TAB SAB Ect Mult Living   3 2 2  1  0 1 0 0 2     Review of Systems  Constitutional: Positive for fever.  HENT: Negative for congestion, rhinorrhea and sore throat.   Eyes: Negative for redness.  Respiratory: Negative for cough and shortness of breath.   Cardiovascular: Negative for chest pain.  Gastrointestinal: Negative for nausea, vomiting, abdominal pain and diarrhea.  Genitourinary: Negative for dysuria.  Musculoskeletal: Positive for back pain (Pain below left shoulder blade) and myalgias.  Skin: Negative for rash.  Neurological: Positive for headaches.  Psychiatric/Behavioral: Negative for confusion.   Allergies  Penicillins and Percocet  Home Medications   Prior to Admission medications   Medication Sig Start Date End Date Taking? Authorizing Provider  acetaminophen (TYLENOL) 500 MG tablet Take 1,000 mg by mouth every 6 (six) hours as needed for pain.     Historical Provider, MD  diazepam (VALIUM) 5 MG tablet Take 1 tablet (5 mg total) by mouth 2 (two) times daily. 01/01/13   Marissa Sciacca, PA-C  HYDROcodone-acetaminophen (NORCO/VICODIN) 5-325 MG per tablet Take 1  tablet by mouth every 8 (eight) hours as needed for pain. 01/01/13   Marissa Sciacca, PA-C  methylPREDNISolone (MEDROL) 4 MG tablet Take 4 mg by mouth daily. 12/27/12   Historical Provider, MD  promethazine (PHENERGAN) 25 MG suppository Place 1 suppository (25 mg total) rectally every 6 (six) hours as needed for nausea. 01/01/13   Marissa Sciacca, PA-C   Triage Vitals: BP 133/93  Pulse 61  Temp(Src) 98.7 F (37.1 C) (Oral)  Resp 18  Wt 181 lb (82.101 kg)  SpO2 100%  Physical Exam  Nursing note and vitals reviewed. Constitutional: She appears well-developed and well-nourished. No  distress.  HENT:  Head: Normocephalic and atraumatic.  Eyes: Conjunctivae and EOM are normal. Right eye exhibits no discharge. Left eye exhibits no discharge.  Neck: Normal range of motion. Neck supple. No tracheal deviation present.  Cardiovascular: Normal rate, regular rhythm and normal heart sounds.   Pulmonary/Chest: Effort normal and breath sounds normal. No respiratory distress.  Abdominal: Soft. There is no tenderness. There is no rebound and no guarding.  Musculoskeletal: She exhibits tenderness. She exhibits no edema.       Cervical back: Normal.       Thoracic back: She exhibits tenderness and spasm. She exhibits normal range of motion, no bony tenderness, no swelling and no edema.       Lumbar back: Normal.       Back:  Neurological: She is alert.  Skin: Skin is warm and dry.  Psychiatric: She has a normal mood and affect. Her behavior is normal.   ED Course  Procedures (including critical care time) DIAGNOSTIC STUDIES: Oxygen Saturation is 100% on RA, nl by my interpretation.    COORDINATION OF CARE: 1:27 PM-Discussed treatment plan which includes imaging with pt at bedside and pt agreed to plan.   Labs Review Labs Reviewed - No data to display  Imaging Review Dg Chest 2 View  01/30/2014   CLINICAL DATA:  Six day history of fever with pain in the left shoulder blade region ; 2 days of generalized chest pain; history of pulmonary embolism and endometrial ablation  EXAM: CHEST  2 VIEW  COMPARISON:  Thoracic spine series of May 10, 2012 and PA and lateral chest x-ray of December 27, 2010  FINDINGS: The lungs are well-expanded and clear. The heart and pulmonary vascularity are normal. The mediastinum is normal in width. There is no pleural effusion or pneumothorax. The trachea is midline.The bony thorax is unremarkable.  IMPRESSION: There is no acute cardiopulmonary abnormality.   Electronically Signed   By: David  [place]   On: 01/30/2014 14:25     EKG  Interpretation None      Patient seen and examined. Work-up initiated.   Vital signs reviewed and are as follows: BP 133/93  Pulse 61  Temp(Src) 98.7 F (37.1 C) (Oral)  Resp 18  Wt 181 lb (82.101 kg)  SpO2 100%  LMP 01/13/2014  Patient discussed and seen by Dr. Criss Alvine. We discussed possibility of PE and whether or not d-dimer is indicated in this case. We jointly decided no. Patient has no vital sign abnormalities, no pleuritic pain, no other exacerbating factors and reproducible pain on exam with associated spasm. It is highly likely that her pain is musculoskeletal with this presentation and are comfortable with forgoing work-up for PE given very low suspicion for same. She will need CXR to r/o PNA given reported intermittent fever.   2:36 PM Imaging negative. Pt informed.   No red flag  s/s of low back pain. Patient was counseled on back pain precautions and told to do activity as tolerated but do not lift, push, or pull heavy objects more than 10 pounds for the next week.  Patient counseled to use ice or heat on back for no longer than 15 minutes every hour.   Patient prescribed muscle relaxer and counseled on proper use of muscle relaxant medication.    Patient prescribed narcotic pain medicine and counseled on proper use of narcotic pain medications. Counseled not to combine this medication with others containing tylenol.   Urged patient not to drink alcohol, drive, or perform any other activities that requires focus while taking either of these medications.  Patient urged to follow-up with PCP if pain does not improve with treatment and rest or if pain becomes recurrent. Urged to return with worsening severe pain, loss of bowel or bladder control, trouble walking.   The patient verbalizes understanding and agrees with the plan.   MDM   Final diagnoses:  Left-sided thoracic back pain   Patient with back pain. Discussion as above. Doubt PE or pulmonary process. No  neurological deficits. Patient is ambulatory. No warning symptoms of back pain including: fecal incontinence, urinary retention or overflow incontinence, night sweats, waking from sleep with back pain, unexplained fevers or weight loss, h/o cancer, IVDU, recent trauma. No concern for cauda equina, epidural abscess, or other serious cause of back pain. Conservative measures such as rest, ice/heat and pain medicine indicated with PCP follow-up if no improvement with conservative management.    I personally performed the services described in this documentation, which was scribed in my presence. The recorded information has been reviewed and is accurate.     Renne CriglerJoshua Donika Butner, PA-C 01/30/14 [id number]

## 2014-01-30 NOTE — ED Provider Notes (Signed)
Medical screening examination/treatment/procedure(s) were conducted as a shared visit with non-physician practitioner(s) and myself.  I personally evaluated the patient during the encounter.   EKG Interpretation None       Patient with point tenderness to left back under scapula. Worse with movement. No cough, hemoptysis or dyspnea. Very different from time she had PE. I believe this is MSK in origin, especially with 1 week of symptoms, no hypoxia and no tachycardia. While she does have PE history, this would be extremity atypical and at this time I feel she does not need workup. Treat pain and discussed return precautions.  Monica CamelScott T Taylin Leder, MD 01/30/14 [phone]

## 2014-03-02 ENCOUNTER — Encounter (HOSPITAL_COMMUNITY): Payer: Self-pay | Admitting: Emergency Medicine

## 2015-11-12 ENCOUNTER — Encounter (HOSPITAL_COMMUNITY): Payer: Self-pay | Admitting: Emergency Medicine

## 2015-11-12 ENCOUNTER — Other Ambulatory Visit: Payer: Self-pay | Admitting: Obstetrics and Gynecology

## 2015-11-12 ENCOUNTER — Emergency Department (HOSPITAL_COMMUNITY)
Admission: EM | Admit: 2015-11-12 | Discharge: 2015-11-12 | Disposition: A | Discharge status: Home / Self Care | Payer: BLUE CROSS/BLUE SHIELD | Attending: Emergency Medicine | Admitting: Emergency Medicine

## 2015-11-12 ENCOUNTER — Emergency Department (HOSPITAL_COMMUNITY): Payer: BLUE CROSS/BLUE SHIELD

## 2015-11-12 DIAGNOSIS — R079 Chest pain, unspecified: Secondary | ICD-10-CM | POA: Diagnosis not present

## 2015-11-12 DIAGNOSIS — R928 Other abnormal and inconclusive findings on diagnostic imaging of breast: Secondary | ICD-10-CM

## 2015-11-12 DIAGNOSIS — Z79899 Other long term (current) drug therapy: Secondary | ICD-10-CM | POA: Insufficient documentation

## 2015-11-12 LAB — CBC WITH DIFFERENTIAL/PLATELET
BASOS ABS: 0 10*3/uL (ref 0.0–0.1)
Basophils Relative: 0 %
EOS ABS: 0.2 10*3/uL (ref 0.0–0.7)
EOS PCT: 4 %
HCT: 36 % (ref 36.0–46.0)
Hemoglobin: 11.4 g/dL — ABNORMAL LOW (ref 12.0–15.0)
LYMPHS PCT: 41 %
Lymphs Abs: 2.2 10*3/uL (ref 0.7–4.0)
MCH: 27.9 pg (ref 26.0–34.0)
MCHC: 31.7 g/dL (ref 30.0–36.0)
MCV: 88.2 fL (ref 78.0–100.0)
Monocytes Absolute: 0.5 10*3/uL (ref 0.1–1.0)
Monocytes Relative: 9 %
Neutro Abs: 2.4 10*3/uL (ref 1.7–7.7)
Neutrophils Relative %: 46 %
PLATELETS: 197 10*3/uL (ref 150–400)
RBC: 4.08 MIL/uL (ref 3.87–5.11)
RDW: 13 % (ref 11.5–15.5)
WBC: 5.3 10*3/uL (ref 4.0–10.5)

## 2015-11-12 LAB — URINE MICROSCOPIC-ADD ON

## 2015-11-12 LAB — BASIC METABOLIC PANEL
Anion gap: 9 (ref 5–15)
BUN: 9 mg/dL (ref 6–20)
CO2: 26 mmol/L (ref 22–32)
CREATININE: 0.65 mg/dL (ref 0.44–1.00)
Calcium: 9.2 mg/dL (ref 8.9–10.3)
Chloride: 102 mmol/L (ref 101–111)
GFR calc non Af Amer: >60 mL/min (ref >60)
Glucose, Bld: 90 mg/dL (ref 65–99)
Potassium: 3.5 mmol/L (ref 3.5–5.1)
Sodium: 137 mmol/L (ref 135–145)

## 2015-11-12 LAB — I-STAT CHEM 8, ED
BUN: 10 mg/dL (ref 6–20)
CALCIUM ION: 1.15 mmol/L (ref 1.13–1.30)
Chloride: 106 mmol/L (ref 101–111)
Creatinine, Ser: 0.7 mg/dL (ref 0.44–1.00)
GLUCOSE: 90 mg/dL (ref 65–99)
HCT: 35 % — ABNORMAL LOW (ref 36.0–46.0)
HEMOGLOBIN: 11.9 g/dL — AB (ref 12.0–15.0)
Potassium: 3.6 mmol/L (ref 3.5–5.1)
Sodium: 140 mmol/L (ref 135–145)
TCO2: 26 mmol/L (ref 0–100)

## 2015-11-12 LAB — PROTIME-INR
INR: 1 (ref 0.00–1.49)
PROTHROMBIN TIME: 13.4 s (ref 11.6–15.2)

## 2015-11-12 LAB — URINALYSIS, ROUTINE W REFLEX MICROSCOPIC
Bilirubin Urine: NEGATIVE
GLUCOSE, UA: NEGATIVE mg/dL
Ketones, ur: NEGATIVE mg/dL
Leukocytes, UA: NEGATIVE
Nitrite: NEGATIVE
PH: 6 (ref 5.0–8.0)
Protein, ur: NEGATIVE mg/dL
SPECIFIC GRAVITY, URINE: 1.014 (ref 1.005–1.030)

## 2015-11-12 LAB — I-STAT BETA HCG BLOOD, ED (MC, WL, AP ONLY): I-stat hCG, quantitative: <5 m[IU]/mL (ref <5)

## 2015-11-12 LAB — I-STAT TROPONIN, ED: Troponin i, poc: 0 ng/mL (ref 0.00–0.08)

## 2015-11-12 MED ORDER — IOPAMIDOL (ISOVUE-370) INJECTION 76%
INTRAVENOUS | Status: AC
  Administered 2015-11-12: 80 mL
  Filled 2015-11-12: qty 100

## 2015-11-12 MED ORDER — FENTANYL CITRATE (PF) 100 MCG/2ML IJ SOLN
50.0000 ug | Freq: Once | INTRAMUSCULAR | Status: AC
  Administered 2015-11-12: 50 ug via INTRAVENOUS
  Filled 2015-11-12: qty 2

## 2015-11-12 MED ORDER — SODIUM CHLORIDE 0.9 % IV BOLUS (SEPSIS)
1000.0000 mL | Freq: Once | INTRAVENOUS | Status: AC
  Administered 2015-11-12: 1000 mL via INTRAVENOUS

## 2015-11-12 NOTE — Discharge Instructions (Signed)
Nonspecific Chest Pain Monica Christensen, your CT scan does not show a PE.  Your EKG and blood work do not show any concern for heart attack.  Please see a primary care doctor within 3 days for close follow up. If symptoms worsen, come back to the ED immediately. Thank you. It is often hard to find the cause of chest pain. There is always a chance that your pain could be related to something serious, such as a heart attack or a blood clot in your lungs. Chest pain can also be caused by conditions that are not life-threatening. If you have chest pain, it is very important to follow up with your doctor.  HOME CARE  If you were prescribed an antibiotic medicine, finish it all even if you start to feel better.  Avoid any activities that cause chest pain.  Do not use any tobacco products, including cigarettes, chewing tobacco, or electronic cigarettes. If you need help quitting, ask your doctor.  Do not drink alcohol.  Take medicines only as told by your doctor.  Keep all follow-up visits as told by your doctor. This is important. This includes any further testing if your chest pain does not go away.  Your doctor may tell you to keep your head raised (elevated) while you sleep.  Make lifestyle changes as told by your doctor. These may include:  Getting regular exercise. Ask your doctor to suggest some activities that are safe for you.  Eating a heart-healthy diet. Your doctor or a diet specialist (dietitian) can help you to learn healthy eating options.  Maintaining a healthy weight.  Managing diabetes, if necessary.  Reducing stress. GET HELP IF:  Your chest pain does not go away, even after treatment.  You have a rash with blisters on your chest.  You have a fever. GET HELP RIGHT AWAY IF:  Your chest pain is worse.  You have an increasing cough, or you cough up blood.  You have severe belly (abdominal) pain.  You feel extremely weak.  You pass out (faint).  You have  chills.  You have sudden, unexplained chest discomfort.  You have sudden, unexplained discomfort in your arms, back, neck, or jaw.  You have shortness of breath at any time.  You suddenly start to sweat, or your skin gets clammy.  You feel nauseous.  You vomit.  You suddenly feel light-headed or dizzy.  Your heart begins to beat quickly, or it feels like it is skipping beats. These symptoms may be an emergency. Do not wait to see if the symptoms will go away. Get medical help right away. Call your local emergency services (911 in the [place] Do not drive yourself to the hospital.   This information is not intended to replace advice given to you by your health care provider. Make sure you discuss any questions you have with your health care provider.   Document Released: 10/04/2007 Document Revised: 05/08/2014 Document Reviewed: 11/21/2013 Elsevier Interactive Patient Education [employer]2016 [employer] Inc[employer]

## 2015-11-12 NOTE — ED Notes (Signed)
Pt states she woke up with left side 7/10 CP radiating to her left arm, denies SOB, fever, chills, n/v at this time.

## 2015-11-12 NOTE — ED Provider Notes (Addendum)
CSN: [id number]     Arrival date & time 11/12/15  0449 History   First MD Initiated Contact with Patient 11/12/15 0505     Chief Complaint  Patient presents with  . Chest Pain     (Consider location/radiation/quality/duration/timing/severity/associated sxs/prior Treatment) HPI [name] is a 45yo female, PMH of PE, here with L sharp CP with sudden onset tonight that woke her up out of her sleep.  Patient states it radiates to her L shoulder and hand.  She has some SOB associated as well.  She denies any vomiting or diaphoresis.  She has no cardiac history and denies PE risk factors (other than having a history of PE).  States her pain has now improved but not completely back to normal.  She denies any recent illness, fever, cough.  There are no further complaints.   10 Systems reviewed and are negative for acute change except as noted in the HPI.    Past Medical History  Diagnosis Date  . pulmonary embolism   . Sinusitis   . Seasonal allergies    Past Surgical History  Procedure Laterality Date  . Cesarean section  2001, 1998  . Tubal ligation    . Endometrial ablation w/ novasure  08/2010   Family History  Problem Relation Age of Onset  . Cancer Mother   . Hypertension Mother   . Coronary artery disease Father   . Hypertension Father    Social History  Substance Use Topics  . Smoking status: Never Smoker   . Smokeless tobacco: Never Used  . Alcohol Use: No   OB History    Gravida Para Term Preterm AB TAB SAB Ectopic Multiple Living   3 2 2  1  0 1 0 0 2     Review of Systems    Allergies  Penicillins and Percocet  Home Medications   Prior to Admission medications   Medication Sig Start Date End Date Taking? Authorizing Provider  loratadine (CLARITIN) 10 MG tablet Take 10 mg by mouth daily.   Yes Historical Provider, MD  diazepam (VALIUM) 5 MG tablet Take 1 tablet (5 mg total) by mouth 2 (two) times daily. Patient not taking: Reported on 11/12/2015 01/01/13    Marissa Sciacca, PA-C  HYDROcodone-acetaminophen (NORCO/VICODIN) 5-325 MG per tablet Take 1 tablet by mouth every 8 (eight) hours as needed for pain. Patient not taking: Reported on 11/12/2015 01/01/13   Marissa Sciacca, PA-C  methocarbamol (ROBAXIN) 500 MG tablet Take 2 tablets (1,000 mg total) by mouth 4 (four) times daily. Patient not taking: Reported on 11/12/2015 01/30/14   Renne Crigler, PA-C  naproxen (NAPROSYN) 500 MG tablet Take 1 tablet (500 mg total) by mouth 2 (two) times daily. Patient not taking: Reported on 11/12/2015 01/30/14   Renne Crigler, PA-C  promethazine (PHENERGAN) 25 MG suppository Place 1 suppository (25 mg total) rectally every 6 (six) hours as needed for nausea. Patient not taking: Reported on 11/12/2015 01/01/13   Marissa Sciacca, PA-C  traMADol (ULTRAM) 50 MG tablet Take 1 tablet (50 mg total) by mouth every 6 (six) hours as needed. Patient not taking: Reported on 11/12/2015 01/30/14   Renne Crigler, PA-C   BP 157/98 mmHg  Pulse 56  Temp(Src) 98.2 F (36.8 C) (Oral)  Resp 9  Ht 5\' 7"  (1.702 m)  Wt 181 lb (82.101 kg)  BMI 28.34 kg/m2  SpO2 99%  LMP 11/07/2015 Physical Exam  Constitutional: She is oriented to person, place, and time. She appears well-developed and well-nourished. No  distress.  HENT:  Head: Normocephalic and atraumatic.  Nose: Nose normal.  Mouth/Throat: Oropharynx is clear and moist. No oropharyngeal exudate.  Eyes: Conjunctivae and EOM are normal. Pupils are equal, round, and reactive to light. No scleral icterus.  Neck: Normal range of motion. Neck supple. No JVD present. No tracheal deviation present. No thyromegaly present.  Cardiovascular: Normal rate, regular rhythm and normal heart sounds.  Exam reveals no gallop and no friction rub.   No murmur heard. Pulmonary/Chest: Effort normal and breath sounds normal. No respiratory distress. She has no wheezes. She exhibits no tenderness.  Abdominal: Soft. Bowel sounds are normal. She exhibits no  distension and no mass. There is no tenderness. There is no rebound and no guarding.  Musculoskeletal: Normal range of motion. She exhibits no edema or tenderness.  Lymphadenopathy:    She has no cervical adenopathy.  Neurological: She is alert and oriented to person, place, and time. No cranial nerve deficit. She exhibits normal muscle tone.  Skin: Skin is warm and dry. No rash noted. She is not diaphoretic. No erythema. No pallor.  Nursing note and vitals reviewed.   ED Course  Procedures (including critical care time) Labs Review Labs Reviewed  CBC WITH DIFFERENTIAL/PLATELET - Abnormal; Notable for the following:    Hemoglobin 11.4 (*)    All other components within normal limits  URINALYSIS, ROUTINE W REFLEX MICROSCOPIC (NOT AT [facility]) - Abnormal; Notable for the following:    Hgb urine dipstick MODERATE (*)    All other components within normal limits  URINE MICROSCOPIC-ADD ON - Abnormal; Notable for the following:    Squamous Epithelial / LPF 0-5 (*)    Bacteria, UA RARE (*)    All other components within normal limits  I-STAT CHEM 8, ED - Abnormal; Notable for the following:    Hemoglobin 11.9 (*)    HCT 35.0 (*)    All other components within normal limits  BASIC METABOLIC PANEL  PROTIME-INR  I-STAT TROPOININ, ED  I-STAT TROPOININ, ED  I-STAT BETA HCG BLOOD, ED ([facility], [facility], [facility] ONLY)    Imaging Review Ct Angio Chest Pe W Or Wo Contrast  11/12/2015  CLINICAL DATA:  Left-sided chest pain radiating to the left arm. Previous history of pulmonary embolus. EXAM: CT ANGIOGRAPHY CHEST WITH CONTRAST TECHNIQUE: Multidetector CT imaging of the chest was performed using the standard protocol during bolus administration of intravenous contrast. Multiplanar CT image reconstructions and MIPs were obtained to evaluate the vascular anatomy. CONTRAST:  80 mL Isovue 370 COMPARISON:  12/27/2010 FINDINGS: Mediastinum/Lymph Nodes: No pulmonary emboli or thoracic aortic dissection identified. No masses  or pathologically enlarged lymph nodes identified. Lungs/Pleura: No pulmonary mass, infiltrate, or effusion. Upper abdomen: No acute findings. Musculoskeletal: No chest wall mass or suspicious bone lesions identified. Review of the MIP images confirms the above findings. IMPRESSION: No evidence of significant pulmonary embolus. No evidence of active pulmonary disease. Electronically Signed   By: Burman NievesWilliam  Stevens M.D.   On: 11/12/2015 06:10   I have personally reviewed and evaluated these images and lab results as part of my medical decision-making.   EKG Interpretation   Date/Time:  Friday November 12 2015 04:55:43 EDT Ventricular Rate:  53 PR Interval:    QRS Duration: 81 QT Interval:  404 QTC Calculation: 380 R Axis:   49 Text Interpretation:  Sinus rhythm No significant change since last  tracing Confirmed by Erroll Lunani, Monica Christensen [phone]) on 11/12/2015 5:06:05 AM      MDM   Final  diagnoses:  None    Patient presents to the ED for CP.  I have concern for repeat PE as she is no longer on blood thinners. Will obtain CTA for evaluation.  Troponin obtained to eval for clot burden if CT is positive.  EKG does not show any ischemic changes.  She was given fentanyl for pain control.    6:26 AM CT negative for PE.  Pain improved with fentanyl.  History is not consistent with ACS. She is low risk and her HEART score is 1 due to age.  Patient advised to fu within primary within 3 days.  She continues to appear well here and in NAD.  Vs remain within her normal limits and she is safe for DC.    Monica Crumble, MD 11/12/15 [phone]

## 2015-11-22 ENCOUNTER — Other Ambulatory Visit: Payer: Self-pay

## 2015-11-23 ENCOUNTER — Ambulatory Visit
Admission: RE | Admit: 2015-11-23 | Discharge: 2015-11-23 | Disposition: A | Discharge status: Home / Self Care | Payer: Self-pay | Source: Ambulatory Visit | Attending: Obstetrics and Gynecology | Admitting: Obstetrics and Gynecology

## 2015-11-23 ENCOUNTER — Other Ambulatory Visit: Payer: Self-pay | Admitting: Obstetrics and Gynecology

## 2015-11-23 ENCOUNTER — Ambulatory Visit
Admission: RE | Admit: 2015-11-23 | Discharge: 2015-11-23 | Disposition: A | Discharge status: Home / Self Care | Payer: BLUE CROSS/BLUE SHIELD | Source: Ambulatory Visit | Attending: Obstetrics and Gynecology | Admitting: Obstetrics and Gynecology

## 2015-11-23 DIAGNOSIS — R928 Other abnormal and inconclusive findings on diagnostic imaging of breast: Secondary | ICD-10-CM

## 2017-01-25 ENCOUNTER — Other Ambulatory Visit: Payer: Self-pay | Admitting: Obstetrics and Gynecology

## 2017-01-25 DIAGNOSIS — N6320 Unspecified lump in the left breast, unspecified quadrant: Secondary | ICD-10-CM

## 2017-01-30 ENCOUNTER — Ambulatory Visit
Admission: RE | Admit: 2017-01-30 | Discharge: 2017-01-30 | Disposition: A | Discharge status: Home / Self Care | Payer: BLUE CROSS/BLUE SHIELD | Source: Ambulatory Visit | Attending: Obstetrics and Gynecology | Admitting: Obstetrics and Gynecology

## 2017-01-30 ENCOUNTER — Other Ambulatory Visit: Payer: Self-pay | Admitting: Obstetrics and Gynecology

## 2017-01-30 DIAGNOSIS — N6320 Unspecified lump in the left breast, unspecified quadrant: Secondary | ICD-10-CM

## 2017-05-27 ENCOUNTER — Emergency Department (HOSPITAL_COMMUNITY): Payer: BLUE CROSS/BLUE SHIELD

## 2017-05-27 ENCOUNTER — Other Ambulatory Visit: Payer: Self-pay

## 2017-05-27 ENCOUNTER — Encounter (HOSPITAL_COMMUNITY): Payer: Self-pay | Admitting: *Deleted

## 2017-05-27 ENCOUNTER — Emergency Department (HOSPITAL_COMMUNITY)
Admission: EM | Admit: 2017-05-27 | Discharge: 2017-05-27 | Disposition: A | Discharge status: Home / Self Care | Payer: BLUE CROSS/BLUE SHIELD | Attending: Emergency Medicine | Admitting: Emergency Medicine

## 2017-05-27 DIAGNOSIS — Z86711 Personal history of pulmonary embolism: Secondary | ICD-10-CM | POA: Insufficient documentation

## 2017-05-27 DIAGNOSIS — R0602 Shortness of breath: Secondary | ICD-10-CM | POA: Insufficient documentation

## 2017-05-27 DIAGNOSIS — R079 Chest pain, unspecified: Secondary | ICD-10-CM | POA: Diagnosis present

## 2017-05-27 DIAGNOSIS — Z79899 Other long term (current) drug therapy: Secondary | ICD-10-CM | POA: Diagnosis not present

## 2017-05-27 DIAGNOSIS — R072 Precordial pain: Secondary | ICD-10-CM | POA: Diagnosis not present

## 2017-05-27 LAB — BASIC METABOLIC PANEL
ANION GAP: 10 (ref 5–15)
BUN: 7 mg/dL (ref 6–20)
CO2: 22 mmol/L (ref 22–32)
Calcium: 9.3 mg/dL (ref 8.9–10.3)
Chloride: 106 mmol/L (ref 101–111)
Creatinine, Ser: 0.7 mg/dL (ref 0.44–1.00)
GFR calc Af Amer: >60 mL/min (ref >60)
Glucose, Bld: 97 mg/dL (ref 65–99)
POTASSIUM: 3.9 mmol/L (ref 3.5–5.1)
SODIUM: 138 mmol/L (ref 135–145)

## 2017-05-27 LAB — I-STAT BETA HCG BLOOD, ED (MC, WL, AP ONLY)

## 2017-05-27 LAB — I-STAT TROPONIN, ED: TROPONIN I, POC: 0 ng/mL (ref 0.00–0.08)

## 2017-05-27 LAB — CBC
HEMATOCRIT: 37 % (ref 36.0–46.0)
Hemoglobin: 12 g/dL (ref 12.0–15.0)
MCH: 28.6 pg (ref 26.0–34.0)
MCHC: 32.4 g/dL (ref 30.0–36.0)
MCV: 88.1 fL (ref 78.0–100.0)
Platelets: 190 10*3/uL (ref 150–400)
RBC: 4.2 MIL/uL (ref 3.87–5.11)
RDW: 13.5 % (ref 11.5–15.5)
WBC: 6.5 10*3/uL (ref 4.0–10.5)

## 2017-05-27 MED ORDER — IOPAMIDOL (ISOVUE-370) INJECTION 76%
INTRAVENOUS | Status: AC
  Administered 2017-05-27: 100 mL via INTRAVENOUS
  Filled 2017-05-27: qty 100

## 2017-05-27 NOTE — ED Triage Notes (Signed)
Pt having upper chest pain for several days but more severe this am on right side. Having sob, no cough. Hx of PE in past but no longer on blood thinners. Recent mild swelling to left lower leg.

## 2017-05-27 NOTE — ED Notes (Signed)
Patient given discharge instructions and verbalized understanding.  Patient stable to discharge at this time.  Patient is alert and oriented to baseline.  No distressed noted at this time.  All belongings taken with the patient at discharge.   

## 2017-05-27 NOTE — ED Provider Notes (Signed)
[facility] EMERGENCY DEPARTMENT Provider Note   CSN: [id number] Arrival date & time: 05/27/17  1136     History   Chief Complaint Chief Complaint  Patient presents with  . Chest Pain  . Shortness of Breath    HPI Monica Christensen is a 47 y.o. female.  The history is provided by the patient and medical records. No language interpreter was used.  Chest Pain   Associated symptoms include shortness of breath. Pertinent negatives include no abdominal pain, no cough, no fever and no palpitations.  Shortness of Breath  Associated symptoms include chest pain. Pertinent negatives include no fever, no cough, no abdominal pain and no leg swelling.   Monica Christensen is a 47 y.o. female  with a PMH of prior PE while on OCP's who presents to the Emergency Department complaining of central and right-sided chest pain which has been progressively worsening over the last 4 days.  Patient states that pain is worse with taking deep breaths or with certain movements such as leaning to the left or the right.  Patient denies trouble breathing currently, but does state every now and then she will feel a sharp pain that takes her breath away or it will feel as if she cannot get a good breath in.  No medications taken prior to arrival for symptoms.  She does state history of similar symptoms once in the past which was when she was diagnosed with a PE.  She states that her physician at the time thought this was due to her OCPs.  She was taken off hormone therapy and has had no prior clotting problems.  She is currently not on anticoagulation.  She denies any recent travel, immobilizations or surgeries.  No lower extremity swelling or pain.  Denies any cough, congestion, fever or chills.  No abdominal pain, back pain.  Past Medical History:  Diagnosis Date  . pulmonary embolism   . Seasonal allergies   . Sinusitis     Patient Active Problem List   Diagnosis Date Noted  . pulmonary embolism      Past Surgical History:  Procedure Laterality Date  . CESAREAN SECTION  2001, 1998  . ENDOMETRIAL ABLATION W/ NOVASURE  08/2010  . TUBAL LIGATION      OB History    Gravida Para Term Preterm AB Living   3 2 2   1 2    SAB TAB Ectopic Multiple Live Births   1 0 0 0         Home Medications    Prior to Admission medications   Medication Sig Start Date End Date Taking? Authorizing Provider  diazepam (VALIUM) 5 MG tablet Take 1 tablet (5 mg total) by mouth 2 (two) times daily. Patient not taking: Reported on 11/12/2015 01/01/13   Sciacca, Ashok Cordia, PA-C  HYDROcodone-acetaminophen (NORCO/VICODIN) 5-325 MG per tablet Take 1 tablet by mouth every 8 (eight) hours as needed for pain. Patient not taking: Reported on 11/12/2015 01/01/13   Sciacca, Ashok Cordia, PA-C  loratadine (CLARITIN) 10 MG tablet Take 10 mg by mouth daily.    [provider]  methocarbamol (ROBAXIN) 500 MG tablet Take 2 tablets (1,000 mg total) by mouth 4 (four) times daily. Patient not taking: Reported on 11/12/2015 01/30/14   Renne Crigler, PA-C  naproxen (NAPROSYN) 500 MG tablet Take 1 tablet (500 mg total) by mouth 2 (two) times daily. Patient not taking: Reported on 11/12/2015 01/30/14   Renne Crigler, PA-C  promethazine (PHENERGAN) 25 MG  suppository Place 1 suppository (25 mg total) rectally every 6 (six) hours as needed for nausea. Patient not taking: Reported on 11/12/2015 01/01/13   Sciacca, Ashok Cordia, PA-C  traMADol (ULTRAM) 50 MG tablet Take 1 tablet (50 mg total) by mouth every 6 (six) hours as needed. Patient not taking: Reported on 11/12/2015 01/30/14   Renne Crigler, PA-C    Family History Family History  Problem Relation Age of Onset  . Cancer Mother   . Hypertension Mother   . Breast cancer Mother   . Coronary artery disease Father   . Hypertension Father     Social History Social History   Tobacco Use  . Smoking status: Never Smoker  . Smokeless tobacco: Never Used  Substance Use Topics  .  Alcohol use: No  . Drug use: No     Allergies   Penicillins and Percocet [oxycodone-acetaminophen]   Review of Systems Review of Systems  Constitutional: Negative for chills and fever.  HENT: Negative for congestion.   Respiratory: Positive for shortness of breath. Negative for cough.   Cardiovascular: Positive for chest pain. Negative for palpitations and leg swelling.  Gastrointestinal: Negative for abdominal pain.  All other systems reviewed and are negative.    Physical Exam Updated Vital Signs BP (!) 134/97   Pulse 71   Temp 98.6 F (37 C) (Oral)   Resp 11   LMP 05/09/2017   SpO2 100%   Physical Exam  Constitutional: She is oriented to person, place, and time. She appears well-developed and well-nourished. No distress.  HENT:  Head: Normocephalic and atraumatic.  Neck: No JVD present.  Cardiovascular: Normal rate, regular rhythm and normal heart sounds.  No murmur heard. Pulmonary/Chest: Effort normal and breath sounds normal. No respiratory distress. She has no wheezes. She has no rales. She exhibits tenderness.  Abdominal: Soft. She exhibits no distension. There is no tenderness.  Musculoskeletal:  No lower extremity edema/swelling. No calf tenderness.  Neurological: She is alert and oriented to person, place, and time.  Skin: Skin is warm and dry.  Nursing note and vitals reviewed.    ED Treatments / Results  Labs (all labs ordered are listed, but only abnormal results are displayed) Labs Reviewed  BASIC METABOLIC PANEL  CBC  I-STAT TROPONIN, ED  I-STAT BETA HCG BLOOD, ED ([facility], [facility], [facility] ONLY)    EKG  EKG Interpretation  Date/Time:  Sunday May 27 2017 11:40:45 EST Ventricular Rate:  75 PR Interval:  156 QRS Duration: 72 QT Interval:  382 QTC Calculation: 426 R Axis:   62 Text Interpretation:  Normal sinus rhythm Normal ECG similar to prior 7/17 Confirmed by Meridee Score [phone]) on 05/27/2017 12:07:55 PM       Radiology Dg Chest 2  View  Result Date: 05/27/2017 CLINICAL DATA:  Shortness of breath and right side chest pain for several days. EXAM: CHEST  2 VIEW COMPARISON:  PA and lateral chest 01/30/2014.  CT chest 11/12/2015. FINDINGS: The lungs are clear. Heart size is normal. No pneumothorax or pleural effusion. No bony abnormality. IMPRESSION: Negative chest. Electronically Signed   By: Drusilla Kanner M.D.   On: 05/27/2017 12:36   Ct Angio Chest Pe W And/or Wo Contrast  Result Date: 05/27/2017 CLINICAL DATA:  Upper chest pain for several days increased on the right EXAM: CT ANGIOGRAPHY CHEST WITH CONTRAST TECHNIQUE: Multidetector CT imaging of the chest was performed using the standard protocol during bolus administration of intravenous contrast. Multiplanar CT image reconstructions and MIPs were obtained to  evaluate the vascular anatomy. CONTRAST:  100mL ISOVUE-370 IOPAMIDOL (ISOVUE-370) INJECTION 76% COMPARISON:  Chest x-ray from earlier in the same day. FINDINGS: Cardiovascular: Thoracic aorta shows no aneurysmal dilatation or dissection. No significant atherosclerotic change is noted. Heart is not significantly enlarged. The pulmonary artery shows a normal branching pattern without intraluminal filling defect to suggest pulmonary embolism. Mediastinum/Nodes: Thoracic inlet is within normal limits. No significant hilar or mediastinal adenopathy is noted. The esophagus is within normal limits. Lungs/Pleura: Lungs are clear. No pleural effusion or pneumothorax. Upper Abdomen: Visualized upper abdomen is within normal limits. Musculoskeletal: Visualized bony structures are within normal limits. Review of the MIP images confirms the above findings. IMPRESSION: No evidence of pulmonary emboli.  No acute abnormality noted. Electronically Signed   By: Alcide CleverMark  Lukens M.D.   On: 05/27/2017 14:17    Procedures Procedures (including critical care time)  Medications Ordered in ED Medications  iopamidol (ISOVUE-370) 76 % injection (100  mLs Intravenous Contrast Given 05/27/17 1352)     Initial Impression / Assessment and Plan / ED Course  I have reviewed the triage vital signs and the nursing notes.  Pertinent labs & imaging results that were available during my care of the patient were reviewed by me and considered in my medical decision making (see chart for details).  Clinical Course as of May 27 1428  Sun May 27, 2017  [id number] 47 year old female with chest pain for 4 days constant for the last 2.  He was initially associated with some throat tightness.  She has a remote history of a PE but is currently not on anticoagulation.  Currently she is not tachycardic not tachypneic and not hypoxic.  Her initial EKG is unremarkable and first troponin negative.  She is pending a CT Pe.  If this is also negative she likely can be discharged to follow-up with her PCP.  [MB]    Clinical Course User Index [MB] Terrilee FilesButler, Michael C, MD   Monica Christensen is a 47 y.o. female who presents to ED for chest pain x 4-5 days.  History of PE in the past with birth control pills.  Currently not on anticoagulation.  EKG normal sinus rhythm.  Troponin negative.  Blood work reviewed and reassuring.  Chest x-ray reviewed with no acute abnormalities.  Given history of prior PE and reported symptoms that feel similar, will obtain CT angio for further evaluation.  If negative, anticipate discharge home with PCP follow-up.  CT angio negative. Results discussed with patient. PCP follow up encouraged. Return precautions discussed. All questions answered.   Final Clinical Impressions(s) / ED Diagnoses   Final diagnoses:  Precordial chest pain    ED Discharge Orders    None       Shakeela Rabadan, Chase PicketJaime Pilcher, PA-C 05/27/17 1431    Terrilee FilesButler, Michael C, MD 05/27/17 [phone]

## 2017-05-27 NOTE — Discharge Instructions (Signed)
It was my pleasure taking care of you today!   You were seen in the Emergency Department today for chest pain.  As we have discussed, today?s blood work and imaging are normal.  Please call your primary care physician to schedule a follow up appointment to discuss your ER visit today.   Return to the Emergency Department for new or worsening symptoms, any additional concerns.

## 2017-05-27 NOTE — ED Notes (Signed)
Pt back from CT

## 2017-07-31 ENCOUNTER — Inpatient Hospital Stay: Admission: RE | Admit: 2017-07-31 | Payer: Self-pay | Source: Ambulatory Visit

## 2019-02-10 IMAGING — MG 2D DIGITAL DIAGNOSTIC BILATERAL MAMMOGRAM WITH CAD AND ADJUNCT T
8 of 15 series · 8 of 35 positions shown · non-contrast
Comparison: Previous exam(s).

CLINICAL DATA: Patient complaining of a left breast lump for 1
month.

EXAM:
2D DIGITAL DIAGNOSTIC LEFT MAMMOGRAM WITH CAD AND ADJUNCT TOMO
ULTRASOUND LEFT BREAST

[L CC (1 of 2)]
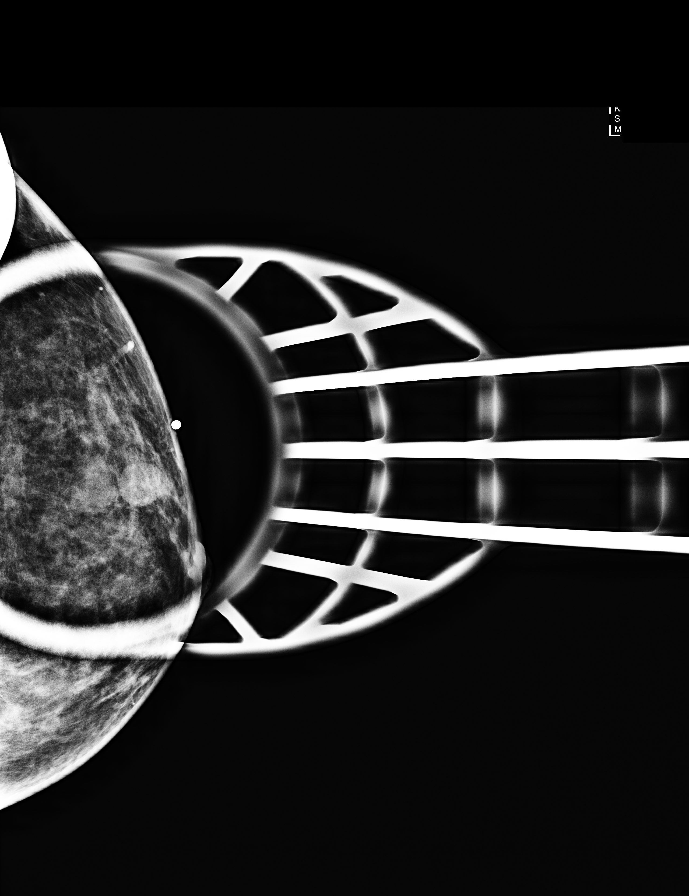

[R CC synth-2D]
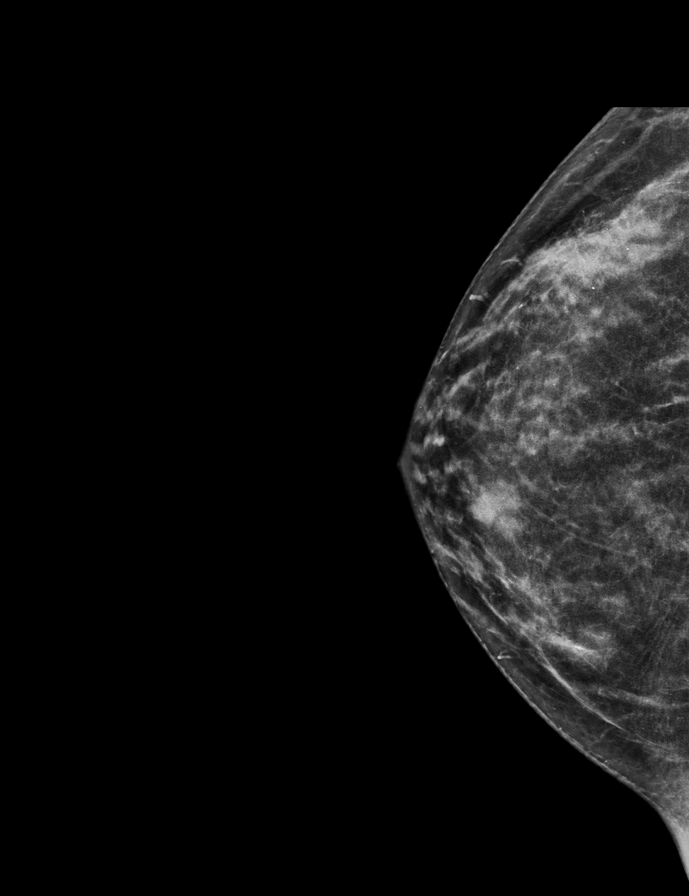

[L MLO synth-2D]
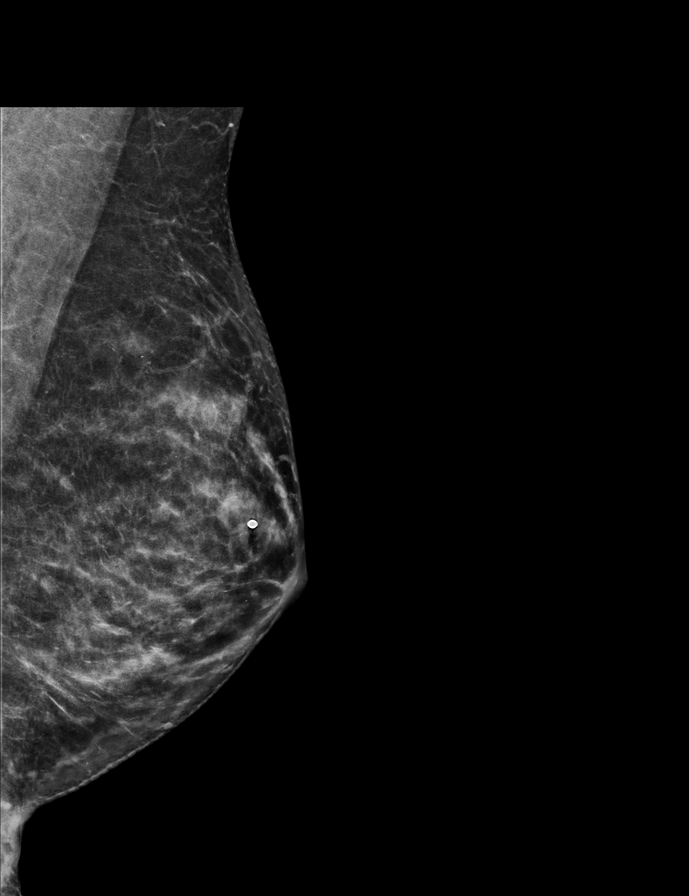

[L CC synth-2D]
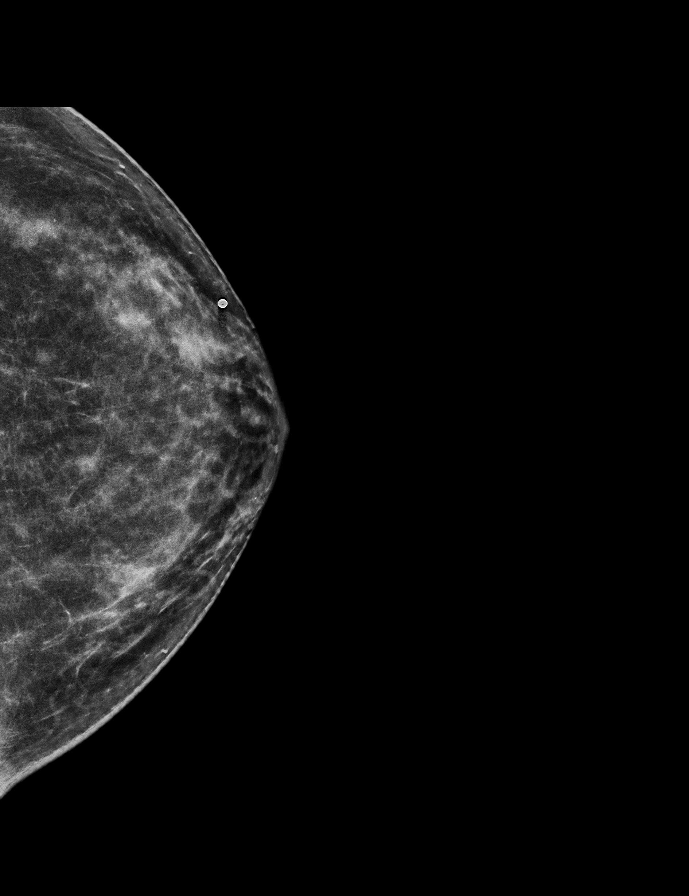

[R MLO synth-2D]
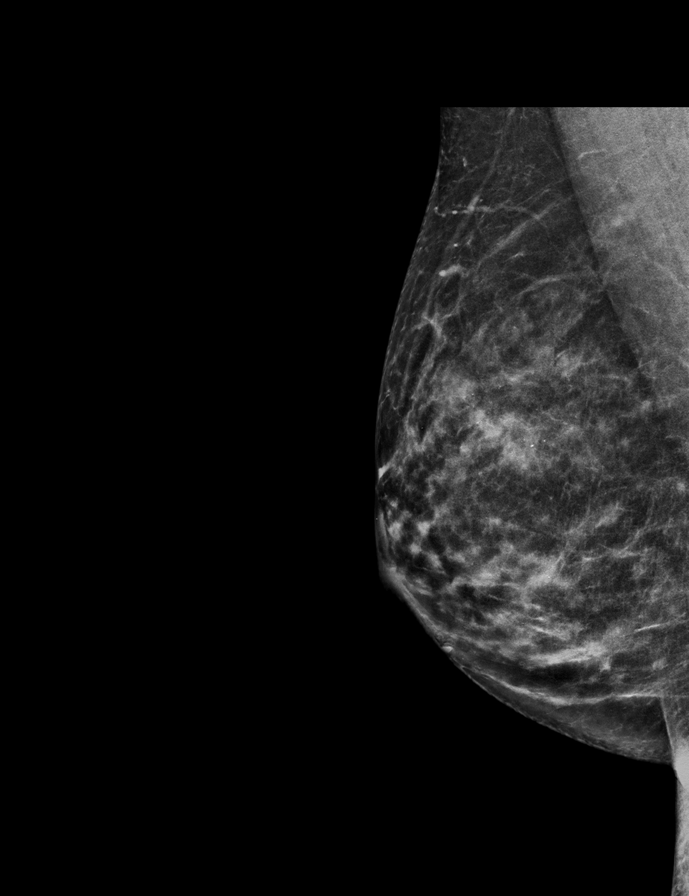

[L MLO]
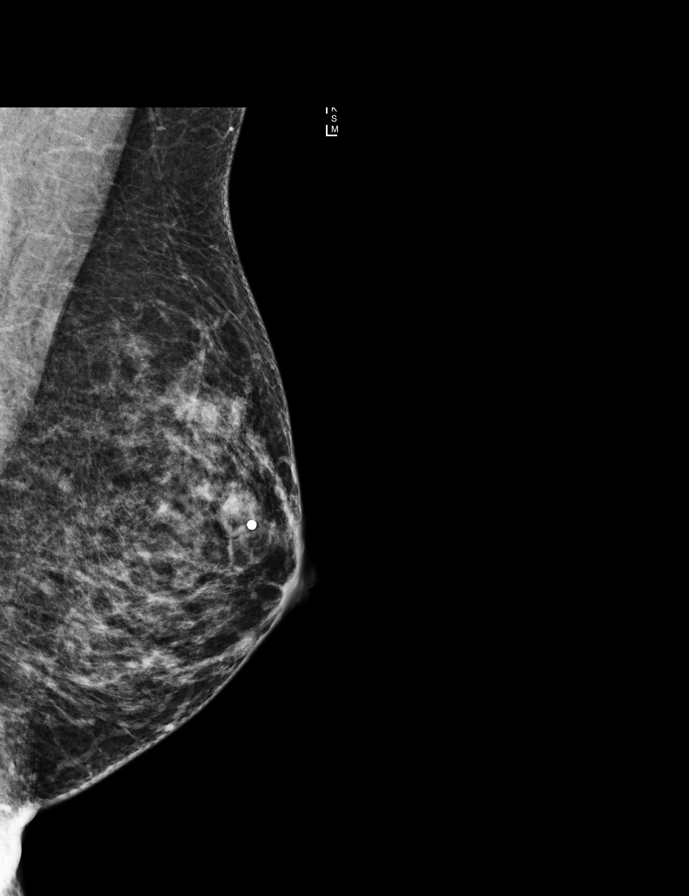

[L CC (2 of 2)]
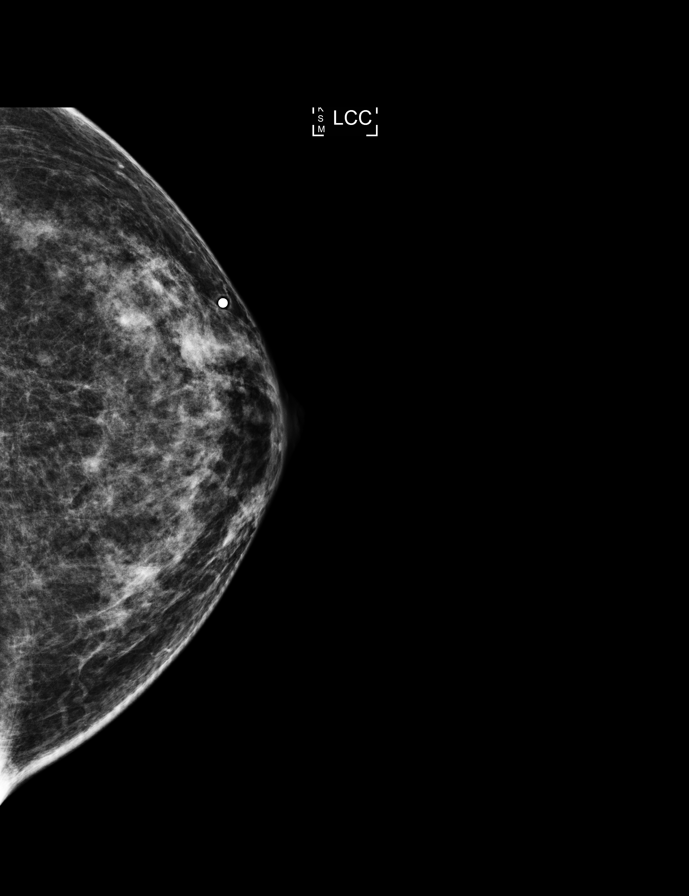

[R CC]
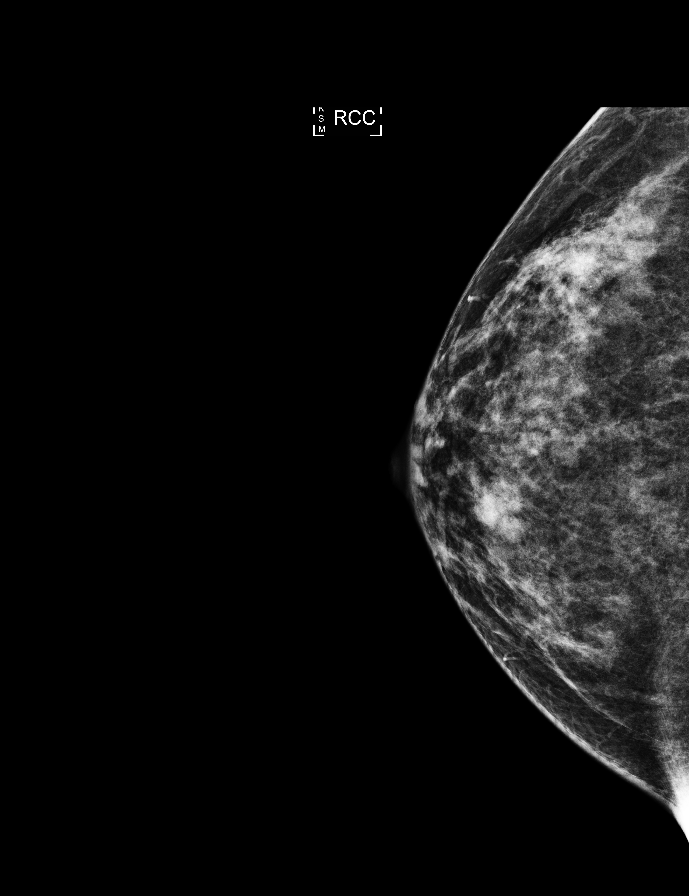

[8 of 35 positions shown; findings below may reference images not displayed]

ACR Breast Density Category c: The breast tissue is heterogeneously
dense, which may obscure small masses.
FINDINGS: There is a discrete mass in the anterior, lateral left breast. It is
oval with circumscribed margins. There is a smaller less
well-defined mass adjacent to this. Both of these appear stable from
the most recent prior mammogram. Several other small nodular
densities are noted throughout the left breast, which are stable
from prior mammograms. On the right, there are no new or suspicious
masses. There are no areas of architectural distortion in either
breast and no suspicious calcifications.

Mammographic images were processed with CAD.

On physical exam, there is a small mobile superficial palpable mass
the lateral periareolar right breast.

Targeted ultrasound is performed, showing an oval simple cyst in the
right breast at 3 o'clock, 1 cm the nipple, measuring 11 x 9 x 10
mm, corresponding to the palpable abnormality. There is a smaller
hypoechoic less well-defined mass directly adjacent to this cyst,
which measures 12 x 5 x 9 mm. This corresponds to the other less
well-defined mass seen mammographically. Sonographic evaluation
throughout the lateral and upper outer left breast demonstrates
several very small additional cysts, but no suspicious lesions.
IMPRESSION: 1. The palpable abnormality is a benign left breast cyst.
2. There is a hypoechoic mass adjacent to the cyst that is most
likely benign, likely a fibroadenoma. Short-term follow-up is
recommended.
3. No evidence of malignancy in the right breast.

RECOMMENDATION:
1. Left breast ultrasound in 6 months.

I have discussed the findings and recommendations with the patient.
Results were also provided in writing at the conclusion of the
visit. If applicable, a reminder letter will be sent to the patient
regarding the next appointment.

BI-RADS CATEGORY  3: Probably benign.

## 2021-10-18 ENCOUNTER — Ambulatory Visit: Payer: Self-pay | Admitting: Surgery

## 2021-10-18 DIAGNOSIS — N6489 Other specified disorders of breast: Secondary | ICD-10-CM

## 2021-10-25 ENCOUNTER — Telehealth: Payer: Self-pay | Admitting: Genetic Counselor

## 2021-10-25 NOTE — Telephone Encounter (Signed)
Scheduled appt per 6/23 referral. Pt is aware of appt date and time. Pt is aware to arrive 15 mins prior to appt time and to bring and updated insurance card. Pt is aware of appt location.

## 2021-12-05 ENCOUNTER — Inpatient Hospital Stay: Payer: Self-pay

## 2021-12-05 ENCOUNTER — Inpatient Hospital Stay: Payer: Self-pay | Attending: Genetic Counselor | Admitting: Genetic Counselor

## 2022-03-10 ENCOUNTER — Other Ambulatory Visit: Payer: Self-pay | Admitting: Surgery

## 2022-03-10 DIAGNOSIS — N6489 Other specified disorders of breast: Secondary | ICD-10-CM

## 2022-04-05 NOTE — Progress Notes (Signed)
Surgical Instructions    Your procedure is scheduled on Wednesday December 13.  Report to [facility] Main Entrance "A" at 8:30am A.M., then check in with the Admitting office.  Call this number if you have problems the morning of surgery:  [phone]   If you have any questions prior to your surgery date call [phone]: Open Monday-Friday 8am-4pm If you experience any cold or flu symptoms such as cough, fever, chills, shortness of breath, etc. between now and your scheduled surgery, please notify us at the above number     Remember:  Do not eat after midnight the night before your surgery  You may drink clear liquids until 7:30am the morning of your surgery.   Clear liquids allowed are: Water, Non-Citrus Juices (without pulp), Carbonated Beverages, Clear Tea, Black Coffee ONLY (NO MILK, CREAM OR POWDERED CREAMER of any kind), and Gatorade  The night before surgery:  No food after midnight. ONLY clear liquids after midnight  The day of surgery (if you do NOT have diabetes):  Drink ONE (1) Pre-Surgery Clear Ensure by 7:30am the morning of surgery. Drink in one sitting. Do not sip.  This drink was given to you during your hospital  pre-op appointment visit.  Nothing else to drink after completing the  Pre-Surgery Clear Ensure.          If you have questions, please contact your surgeon's office.    Take these medicines the morning of surgery with A SIP OF WATER:  loratadine (CLARITIN)   As of today, STOP taking any Aspirin (unless otherwise instructed by your surgeon) Aleve, Naproxen, Ibuprofen, Motrin, Advil, Goody's, BC's, all herbal medications, fish oil, and all vitamins.           Do not wear jewelry or makeup. Do not wear lotions, powders, perfumes/cologne or deodorant. Do not shave 48 hours prior to surgery.  Men may shave face and neck. Do not bring valuables to the hospital. Do not wear nail polish, gel polish, artificial nails, or any other type of covering on  natural nails (fingers and toes) If you have artificial nails or gel coating that need to be removed by a nail salon, please have this removed prior to surgery. Artificial nails or gel coating may interfere with anesthesia's ability to adequately monitor your vital signs.  [facility] is not responsible for any belongings or valuables.    Do NOT Smoke (Tobacco/Vaping)  24 hours prior to your procedure  If you use a CPAP at night, you may bring your mask for your overnight stay.   Contacts, glasses, hearing aids, dentures or partials may not be worn into surgery, please bring cases for these belongings   For patients admitted to the hospital, discharge time will be determined by your treatment team.   Patients discharged the day of surgery will not be allowed to drive home, and someone needs to stay with them for 24 hours.   SURGICAL WAITING ROOM VISITATION Patients having surgery or a procedure may have no more than 2 support people in the waiting area - these visitors may rotate.   Children under the age of 87 must have an adult with them who is not the patient. If the patient needs to stay at the hospital during part of their recovery, the visitor guidelines for inpatient rooms apply. Pre-op nurse will coordinate an appropriate time for 1 support person to accompany patient in pre-op.  This support person may not rotate.   Please refer to [email or web address] for  the visitor guidelines for Inpatients (after your surgery is over and you are in a regular room).    Special instructions:    Oral Hygiene is also important to reduce your risk of infection.  Remember - BRUSH YOUR TEETH THE MORNING OF SURGERY WITH YOUR REGULAR TOOTHPASTE   [facility]- Preparing For Surgery  Before surgery, you can play an important role. Because skin is not sterile, your skin needs to be as free of germs as possible. You can reduce the number of germs on  your skin by washing with CHG (chlorahexidine gluconate) Soap before surgery.  CHG is an antiseptic cleaner which kills germs and bonds with the skin to continue killing germs even after washing.     Please do not use if you have an allergy to CHG or antibacterial soaps. If your skin becomes reddened/irritated stop using the CHG.  Do not shave (including legs and underarms) for at least 48 hours prior to first CHG shower. It is OK to shave your face.  Please follow these instructions carefully.     Shower the NIGHT BEFORE SURGERY and the MORNING OF SURGERY with CHG Soap.   If you chose to wash your hair, wash your hair first as usual with your normal shampoo. After you shampoo, rinse your hair and body thoroughly to remove the shampoo.  Then [employer] and genitals (private parts) with your normal soap and rinse thoroughly to remove soap.  After that Use CHG Soap as you would any other liquid soap. You can apply CHG directly to the skin and wash gently with a scrungie or a clean washcloth.   Apply the CHG Soap to your body ONLY FROM THE NECK DOWN.  Do not use on open wounds or open sores. Avoid contact with your eyes, ears, mouth and genitals (private parts). Wash Face and genitals (private parts)  with your normal soap.   Wash thoroughly, paying special attention to the area where your surgery will be performed.  Thoroughly rinse your body with warm water from the neck down.  DO NOT shower/wash with your normal soap after using and rinsing off the CHG Soap.  Pat yourself dry with a CLEAN TOWEL.  Wear CLEAN PAJAMAS to bed the night before surgery  Place CLEAN SHEETS on your bed the night before your surgery  DO NOT SLEEP WITH PETS.   Day of Surgery:  Take a shower with CHG soap. Wear Clean/Comfortable clothing the morning of surgery Do not apply any deodorants/lotions.   Remember to brush your teeth WITH YOUR REGULAR TOOTHPASTE.    If you received a COVID test during your  pre-op visit, it is requested that you wear a mask when out in public, stay away from anyone that may not be feeling well, and notify your surgeon if you develop symptoms. If you have been in contact with anyone that has tested positive in the last 10 days, please notify your surgeon.    Please read over the following fact sheets that you were given.

## 2022-04-06 ENCOUNTER — Encounter (HOSPITAL_COMMUNITY)
Admission: RE | Admit: 2022-04-06 | Discharge: 2022-04-06 | Disposition: A | Discharge status: Home / Self Care | Payer: BC Managed Care – PPO | Source: Ambulatory Visit | Attending: Surgery | Admitting: Surgery

## 2022-04-06 ENCOUNTER — Encounter (HOSPITAL_COMMUNITY): Payer: Self-pay | Admitting: *Deleted

## 2022-04-06 ENCOUNTER — Other Ambulatory Visit: Payer: Self-pay

## 2022-04-06 VITALS — BP 120/78 | HR 63 | Temp 98.1°F | Resp 18 | Ht 66.0 in | Wt 193.3 lb

## 2022-04-06 DIAGNOSIS — Z01818 Encounter for other preprocedural examination: Secondary | ICD-10-CM | POA: Insufficient documentation

## 2022-04-06 DIAGNOSIS — I10 Essential (primary) hypertension: Secondary | ICD-10-CM | POA: Insufficient documentation

## 2022-04-06 HISTORY — DX: Essential (primary) hypertension: I10

## 2022-04-06 HISTORY — DX: Pneumonia, unspecified organism: J18.9

## 2022-04-06 LAB — BASIC METABOLIC PANEL
Anion gap: 7 (ref 5–15)
BUN: 14 mg/dL (ref 6–20)
CO2: 29 mmol/L (ref 22–32)
Calcium: 10.5 mg/dL — ABNORMAL HIGH (ref 8.9–10.3)
Chloride: 104 mmol/L (ref 98–111)
Creatinine, Ser: 0.65 mg/dL (ref 0.44–1.00)
GFR, Estimated: >60 mL/min (ref >60)
Glucose, Bld: 103 mg/dL — ABNORMAL HIGH (ref 70–99)
Potassium: 3.3 mmol/L — ABNORMAL LOW (ref 3.5–5.1)
Sodium: 140 mmol/L (ref 135–145)

## 2022-04-06 LAB — CBC
HCT: 36.5 % (ref 36.0–46.0)
Hemoglobin: 11.3 g/dL — ABNORMAL LOW (ref 12.0–15.0)
MCH: 27.9 pg (ref 26.0–34.0)
MCHC: 31 g/dL (ref 30.0–36.0)
MCV: 90.1 fL (ref 80.0–100.0)
Platelets: 188 10*3/uL (ref 150–400)
RBC: 4.05 MIL/uL (ref 3.87–5.11)
RDW: 13.8 % (ref 11.5–15.5)
WBC: 5.1 10*3/uL (ref 4.0–10.5)
nRBC: 0 % (ref 0.0–0.2)

## 2022-04-06 LAB — POCT PREGNANCY, URINE: Preg Test, Ur: NEGATIVE

## 2022-04-06 NOTE — Progress Notes (Signed)
PCP - Ardeen Jourdain Cardiologist - pt denies  PPM/ICD - pt denies Device Orders - n/a Rep Notified - n/a  Chest x-ray - n/a EKG - 04/06/22 Stress Test - pt denies ECHO -pt denies  Cardiac Cath - pt denies  Sleep Study - pt denies CPAP - n/a  Fasting Blood Sugar - pt denies Diabetes  Checks Blood Sugar _____ times a day  Last dose of GLP1 agonist-  pt denies GLP1 instructions: n/a  Blood Thinner Instructions:pt denies Aspirin Instructions:pt denies  ERAS Protcol -yes PRE-SURGERY Ensure given at PAT.    COVID TEST- n/a   Anesthesia review: YES. Radioactive seed placement with lumpectomy surgery. Patient is aware of her seed placement appointment Tuesday day before surgery per patient.   Patient denies shortness of breath, fever, cough and chest pain at PAT appointment   All instructions explained to the patient, with a verbal understanding of the material. Patient agrees to go over the instructions while at home for a better understanding. Patient also instructed to self quarantine after being tested for COVID-19. The opportunity to ask questions was provided.

## 2022-04-07 ENCOUNTER — Encounter (HOSPITAL_COMMUNITY): Payer: Self-pay

## 2022-04-07 NOTE — Progress Notes (Signed)
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  History includes never smoker, HTN, PE (02/02/10, RLL PE in setting of BCP), right breast complex sclerosing lesion.  Family history of breast and ovarian cancer.  RSL scheduled for 04/11/2022 at 8:15 AM.  Urine pregnancy test - 04/06/2022.  Anesthesia team to evaluate on the day of surgery.   VS: BP 120/78   Pulse 63   Temp 36.7 C (Oral)   Resp 18   Ht 5\' 6"  (1.676 m)   Wt 87.7 kg   LMP 11/07/2021 (Approximate)   SpO2 100%   BMI 31.20 kg/m   PROVIDERS: 01/08/2022, PA-C is PCP    LABS: Preoperative labs reviewed.  (all labs ordered are listed, but only abnormal results are displayed)  Labs Reviewed  BASIC METABOLIC PANEL - Abnormal; Notable for the following components:      Result Value   Potassium 3.3 (*)    Glucose, Bld 103 (*)    Calcium 10.5 (*)    All other components within normal limits  CBC - Abnormal; Notable for the following components:   Hemoglobin 11.3 (*)    All other components within normal limits  POCT PREGNANCY, URINE     EKG: 04/06/22: SB at 58 bpm   CV: N/A  Past Medical History:  Diagnosis Date   Hypertension    Pneumonia    15 years ago per pt   pulmonary embolism 02/02/2010   RLL PE in settin of birth control pills   Seasonal allergies    Sinusitis     Past Surgical History:  Procedure Laterality Date   CESAREAN SECTION  2001, 1998   ENDOMETRIAL ABLATION W/ NOVASURE  08/2010   TUBAL LIGATION      MEDICATIONS:  hydrochlorothiazide (HYDRODIURIL) 25 MG tablet   Multiple Vitamin (MULTIVITAMIN) tablet   No current  facility-administered medications for this encounter.    09/2010, PA-C Surgical Short Stay/Anesthesiology [facility] Phone [phone] [facility] Phone [phone] 04/07/2022 6:20 PM

## 2022-04-11 NOTE — H&P (Signed)
   REFERRING PHYSICIAN: Mirian Capuchin, MD  PROVIDER: Hayden Rasmussen, MD  MRN: [id number] DOB: 07/13/1970  Subjective  Chief Complaint: New Patient (Rt breast complex lesion )   History of Present Illness: Monica Christensen is a 51 y.o. female who is seen tas an office consultation for evaluation of New Patient (Rt breast complex lesion ) .  Patient sent for evaluation of abnormal mammogram. She was noted to have an area of calcifications right breast upper outer quadrant suspicious in nature. Core biopsy showed radial scar. She relates a family history of breast and ovarian cancer in her mother initially with ovarian cancer at age 77 and breast cancer in her 51s. She is now deceased. She denies any history of breast pain, nipple discharge or breast mass bilaterally.  Review of Systems: A complete review of systems was obtained from the patient. I have reviewed this information and discussed as appropriate with the patient. See HPI as well for other ROS.    Medical History: Past Medical History: Diagnosis Date Anemia  There is no problem list on file for this patient.  Past Surgical History: Procedure Laterality Date c sections   Allergies Allergen Reactions Oxycodone-Acetaminophen Itching  Current Outpatient Medications on File Prior to Visit Medication Sig Dispense Refill hydroCHLOROthiazide (HYDRODIURIL) 25 MG tablet Take 25 mg by mouth once daily  No current facility-administered medications on file prior to visit.  Family History Problem Relation Age of Onset Breast cancer Mother High blood pressure (Hypertension) Father Coronary Artery Disease (Blocked arteries around heart) Father   Social History  Tobacco Use Smoking Status Never Smokeless Tobacco Never   Social History  Socioeconomic History Marital status: Married Tobacco Use Smoking status: Never Smokeless tobacco: Never Substance and Sexual Activity Alcohol use: Never Drug use:  Never  Objective:  Vitals:  BP: 128/76 Pulse: 82 Temp: 36.8 C (98.3 F) Weight: 87.3 kg (192 lb 6.4 oz) Height: 170.2 cm (5\' 7" )  Body mass index is 30.13 kg/m.  Physical Exam HENT: Head: Normocephalic. Cardiovascular: Rate and Rhythm: Normal rate. Pulmonary: Effort: Pulmonary effort is normal. Chest: Breasts: Right: Normal. Left: Normal. Musculoskeletal: General: Normal range of motion. Lymphadenopathy: Upper Body: Right upper body: No supraclavicular or axillary adenopathy. Left upper body: No supraclavicular or axillary adenopathy. Skin: General: Skin is warm. Neurological: General: No focal deficit present. Mental Status: She is alert.   Labs, Imaging and Diagnostic Testing: Mammogram from [facility] shows 2 cm area of calcifications right breast upper outer quadrant core biopsy shown to be radial scar.  Assessment and Plan:  Diagnoses and all orders for this visit:  Radial scar of right breast  Breast cancer screening, high risk patient    Recommend right breast seed localized lumpectomy for radial scar due to potential upgrade risks. Also she has a strong family history of ovarian and breast cancer. Risk of bleeding, infection, cosmetic deformity, pain, drainage, the need further treatments and/or procedures as well as anesthesia risk discussed. The use of seed is discussed as well. The procedure has been discussed with the patient. Alternatives to surgery have been discussed with the patient. Risks of surgery include bleeding, Infection, Seroma formation, death, and the need for further surgery. The patient understands and wishes to proceed.  Refer to genetics for genetic testing and evaluation due to family history  High risk given lifetime risk of 20% with this history ans TC model calculation recommend yearly MRI

## 2022-04-12 ENCOUNTER — Encounter (HOSPITAL_COMMUNITY): Payer: Self-pay | Admitting: Surgery

## 2022-04-12 ENCOUNTER — Ambulatory Visit (HOSPITAL_COMMUNITY)
Admission: RE | Admit: 2022-04-12 | Discharge: 2022-04-12 | Disposition: A | Discharge status: Home / Self Care | Payer: BC Managed Care – PPO | Attending: Surgery | Admitting: Surgery

## 2022-04-12 ENCOUNTER — Other Ambulatory Visit: Payer: Self-pay

## 2022-04-12 ENCOUNTER — Ambulatory Visit (HOSPITAL_COMMUNITY): Payer: BC Managed Care – PPO | Admitting: Anesthesiology

## 2022-04-12 ENCOUNTER — Encounter (HOSPITAL_COMMUNITY)
Admission: RE | Disposition: A | Discharge status: Home / Self Care | Payer: Self-pay | Source: Home / Self Care | Attending: Surgery

## 2022-04-12 ENCOUNTER — Ambulatory Visit (HOSPITAL_COMMUNITY): Payer: BC Managed Care – PPO | Admitting: Vascular Surgery

## 2022-04-12 DIAGNOSIS — N6021 Fibroadenosis of right breast: Secondary | ICD-10-CM | POA: Insufficient documentation

## 2022-04-12 DIAGNOSIS — N6011 Diffuse cystic mastopathy of right breast: Secondary | ICD-10-CM | POA: Diagnosis not present

## 2022-04-12 DIAGNOSIS — N6081 Other benign mammary dysplasias of right breast: Secondary | ICD-10-CM | POA: Insufficient documentation

## 2022-04-12 DIAGNOSIS — Z803 Family history of malignant neoplasm of breast: Secondary | ICD-10-CM | POA: Diagnosis not present

## 2022-04-12 DIAGNOSIS — I10 Essential (primary) hypertension: Secondary | ICD-10-CM | POA: Insufficient documentation

## 2022-04-12 DIAGNOSIS — L905 Scar conditions and fibrosis of skin: Secondary | ICD-10-CM | POA: Insufficient documentation

## 2022-04-12 DIAGNOSIS — Z8041 Family history of malignant neoplasm of ovary: Secondary | ICD-10-CM | POA: Diagnosis not present

## 2022-04-12 HISTORY — PX: BREAST LUMPECTOMY WITH RADIOACTIVE SEED LOCALIZATION: SHX6424

## 2022-04-12 SURGERY — BREAST LUMPECTOMY WITH RADIOACTIVE SEED LOCALIZATION
Anesthesia: General | Site: Breast | Laterality: Right

## 2022-04-12 MED ORDER — PHENYLEPHRINE 80 MCG/ML (10ML) SYRINGE FOR IV PUSH (FOR BLOOD PRESSURE SUPPORT)
PREFILLED_SYRINGE | INTRAVENOUS | Status: AC
  Filled 2022-04-12: qty 10

## 2022-04-12 MED ORDER — ACETAMINOPHEN 160 MG/5ML PO SOLN: 325.0000 mg | ORAL | Status: DC | PRN

## 2022-04-12 MED ORDER — MIDAZOLAM HCL 2 MG/2ML IJ SOLN
INTRAMUSCULAR | Status: DC | PRN
  Administered 2022-04-12: 2 mg via INTRAVENOUS

## 2022-04-12 MED ORDER — FENTANYL CITRATE (PF) 250 MCG/5ML IJ SOLN
INTRAMUSCULAR | Status: AC
  Filled 2022-04-12: qty 5

## 2022-04-12 MED ORDER — FENTANYL CITRATE (PF) 100 MCG/2ML IJ SOLN: 25.0000 ug | INTRAMUSCULAR | Status: DC | PRN

## 2022-04-12 MED ORDER — LIDOCAINE 2% (20 MG/ML) 5 ML SYRINGE
INTRAMUSCULAR | Status: AC
  Filled 2022-04-12: qty 5

## 2022-04-12 MED ORDER — ACETAMINOPHEN 325 MG PO TABS: 325.0000 mg | ORAL_TABLET | ORAL | Status: DC | PRN

## 2022-04-12 MED ORDER — CHLORHEXIDINE GLUCONATE CLOTH 2 % EX PADS: 6.0000 | MEDICATED_PAD | Freq: Once | CUTANEOUS | Status: DC

## 2022-04-12 MED ORDER — FENTANYL CITRATE (PF) 250 MCG/5ML IJ SOLN
INTRAMUSCULAR | Status: DC | PRN
  Administered 2022-04-12 (×2): 25 ug via INTRAVENOUS

## 2022-04-12 MED ORDER — EPHEDRINE SULFATE-NACL 50-0.9 MG/10ML-% IV SOSY
PREFILLED_SYRINGE | INTRAVENOUS | Status: DC | PRN
  Administered 2022-04-12: 5 mg via INTRAVENOUS

## 2022-04-12 MED ORDER — PROPOFOL 10 MG/ML IV BOLUS
INTRAVENOUS | Status: DC | PRN
  Administered 2022-04-12: 40 mg via INTRAVENOUS
  Administered 2022-04-12: 150 mg via INTRAVENOUS
  Administered 2022-04-12: 50 mg via INTRAVENOUS

## 2022-04-12 MED ORDER — 0.9 % SODIUM CHLORIDE (POUR BTL) OPTIME
TOPICAL | Status: DC | PRN
  Administered 2022-04-12: 1000 mL

## 2022-04-12 MED ORDER — DEXAMETHASONE SODIUM PHOSPHATE 10 MG/ML IJ SOLN
INTRAMUSCULAR | Status: AC
  Filled 2022-04-12: qty 1

## 2022-04-12 MED ORDER — AMISULPRIDE (ANTIEMETIC) 5 MG/2ML IV SOLN: 10.0000 mg | Freq: Once | INTRAVENOUS | Status: DC | PRN

## 2022-04-12 MED ORDER — CHLORHEXIDINE GLUCONATE 0.12 % MT SOLN
15.0000 mL | Freq: Once | OROMUCOSAL | Status: AC
  Administered 2022-04-12: 15 mL via OROMUCOSAL
  Filled 2022-04-12: qty 15

## 2022-04-12 MED ORDER — TRAMADOL HCL 50 MG PO TABS: 50.0000 mg | ORAL_TABLET | Freq: Four times a day (QID) | ORAL | 0 refills | Status: AC | PRN

## 2022-04-12 MED ORDER — ORAL CARE MOUTH RINSE: 15.0000 mL | Freq: Once | OROMUCOSAL | Status: AC

## 2022-04-12 MED ORDER — EPHEDRINE 5 MG/ML INJ
INTRAVENOUS | Status: AC
  Filled 2022-04-12: qty 5

## 2022-04-12 MED ORDER — SCOPOLAMINE 1 MG/3DAYS TD PT72: 1.0000 | MEDICATED_PATCH | TRANSDERMAL | Status: DC

## 2022-04-12 MED ORDER — ENSURE PRE-SURGERY PO LIQD: 296.0000 mL | Freq: Once | ORAL | Status: DC

## 2022-04-12 MED ORDER — ACETAMINOPHEN 500 MG PO TABS
1000.0000 mg | ORAL_TABLET | ORAL | Status: AC
  Administered 2022-04-12: 1000 mg via ORAL
  Filled 2022-04-12: qty 2

## 2022-04-12 MED ORDER — PHENYLEPHRINE 80 MCG/ML (10ML) SYRINGE FOR IV PUSH (FOR BLOOD PRESSURE SUPPORT)
PREFILLED_SYRINGE | INTRAVENOUS | Status: DC | PRN
  Administered 2022-04-12: 120 ug via INTRAVENOUS
  Administered 2022-04-12: 80 ug via INTRAVENOUS
  Administered 2022-04-12: 160 ug via INTRAVENOUS
  Administered 2022-04-12: 80 ug via INTRAVENOUS

## 2022-04-12 MED ORDER — MIDAZOLAM HCL 2 MG/2ML IJ SOLN
INTRAMUSCULAR | Status: AC
  Filled 2022-04-12: qty 2

## 2022-04-12 MED ORDER — ONDANSETRON HCL 4 MG/2ML IJ SOLN
INTRAMUSCULAR | Status: DC | PRN
  Administered 2022-04-12: 4 mg via INTRAVENOUS

## 2022-04-12 MED ORDER — PROMETHAZINE HCL 25 MG/ML IJ SOLN: 6.2500 mg | INTRAMUSCULAR | Status: DC | PRN

## 2022-04-12 MED ORDER — DEXAMETHASONE SODIUM PHOSPHATE 10 MG/ML IJ SOLN
INTRAMUSCULAR | Status: DC | PRN
  Administered 2022-04-12: 10 mg via INTRAVENOUS

## 2022-04-12 MED ORDER — LACTATED RINGERS IV SOLN: INTRAVENOUS | Status: DC

## 2022-04-12 MED ORDER — BUPIVACAINE-EPINEPHRINE (PF) 0.5% -1:200000 IJ SOLN
INTRAMUSCULAR | Status: DC | PRN
  Administered 2022-04-12: 20 mL

## 2022-04-12 MED ORDER — PROPOFOL 10 MG/ML IV BOLUS
INTRAVENOUS | Status: AC
  Filled 2022-04-12: qty 20

## 2022-04-12 MED ORDER — CIPROFLOXACIN IN D5W 400 MG/200ML IV SOLN
400.0000 mg | INTRAVENOUS | Status: AC
  Administered 2022-04-12: 400 mg via INTRAVENOUS
  Filled 2022-04-12: qty 200

## 2022-04-12 MED ORDER — ACETAMINOPHEN 10 MG/ML IV SOLN: 1000.0000 mg | Freq: Once | INTRAVENOUS | Status: DC | PRN

## 2022-04-12 MED ORDER — ONDANSETRON HCL 4 MG/2ML IJ SOLN
INTRAMUSCULAR | Status: AC
  Filled 2022-04-12: qty 2

## 2022-04-12 MED ORDER — LIDOCAINE 2% (20 MG/ML) 5 ML SYRINGE
INTRAMUSCULAR | Status: DC | PRN
  Administered 2022-04-12: 40 mg via INTRAVENOUS

## 2022-04-12 SURGICAL SUPPLY — 31 items
APPLIER CLIP 9.375 MED OPEN (MISCELLANEOUS) ×1
BAG COUNTER SPONGE SURGICOUNT (BAG) ×1 IMPLANT
BINDER BREAST LRG (GAUZE/BANDAGES/DRESSINGS) IMPLANT
BINDER BREAST XLRG (GAUZE/BANDAGES/DRESSINGS) IMPLANT
CANISTER SUCT 3000ML PPV (MISCELLANEOUS) ×1 IMPLANT
CHLORAPREP W/TINT 26 (MISCELLANEOUS) ×1 IMPLANT
CLIP APPLIE 9.375 MED OPEN (MISCELLANEOUS) ×1 IMPLANT
COVER PROBE W GEL 5X96 (DRAPES) ×1 IMPLANT
COVER SURGICAL LIGHT HANDLE (MISCELLANEOUS) ×1 IMPLANT
DERMABOND ADVANCED .7 DNX12 (GAUZE/BANDAGES/DRESSINGS) ×1 IMPLANT
DEVICE DUBIN SPECIMEN MAMMOGRA (MISCELLANEOUS) ×1 IMPLANT
DRAPE CHEST BREAST 15X10 FENES (DRAPES) ×1 IMPLANT
ELECT CAUTERY BLADE 6.4 (BLADE) ×1 IMPLANT
ELECT REM PT RETURN 9FT ADLT (ELECTROSURGICAL) ×1
ELECTRODE REM PT RTRN 9FT ADLT (ELECTROSURGICAL) ×1 IMPLANT
GLOVE SURG SIGNA 7.5 PF LTX (GLOVE) ×1 IMPLANT
GOWN STRL REUS W/ TWL LRG LVL3 (GOWN DISPOSABLE) ×1 IMPLANT
GOWN STRL REUS W/ TWL XL LVL3 (GOWN DISPOSABLE) ×1 IMPLANT
GOWN STRL REUS W/TWL LRG LVL3 (GOWN DISPOSABLE) ×1
GOWN STRL REUS W/TWL XL LVL3 (GOWN DISPOSABLE) ×1
KIT BASIN OR (CUSTOM PROCEDURE TRAY) ×1 IMPLANT
KIT MARKER MARGIN INK (KITS) ×1 IMPLANT
NDL HYPO 25GX1X1/2 BEV (NEEDLE) ×1 IMPLANT
NEEDLE HYPO 25GX1X1/2 BEV (NEEDLE) ×1 IMPLANT
NS IRRIG 1000ML POUR BTL (IV SOLUTION) IMPLANT
PACK GENERAL/GYN (CUSTOM PROCEDURE TRAY) ×1 IMPLANT
SUT MNCRL AB 4-0 PS2 18 (SUTURE) ×1 IMPLANT
SUT VIC AB 3-0 SH 18 (SUTURE) ×1 IMPLANT
SYR CONTROL 10ML LL (SYRINGE) ×1 IMPLANT
TOWEL GREEN STERILE (TOWEL DISPOSABLE) ×1 IMPLANT
TOWEL GREEN STERILE FF (TOWEL DISPOSABLE) ×1 IMPLANT

## 2022-04-12 NOTE — Anesthesia Postprocedure Evaluation (Signed)
Anesthesia Post Note  Patient: Monica Christensen  Procedure(s) Performed: RIGHT BREAST LUMPECTOMY WITH RADIOACTIVE SEED LOCALIZATION (Right: Breast)     Patient location during evaluation: PACU Anesthesia Type: General Level of consciousness: awake and alert Pain management: pain level controlled Vital Signs Assessment: post-procedure vital signs reviewed and stable Respiratory status: spontaneous breathing, nonlabored ventilation, respiratory function stable and patient connected to nasal cannula oxygen Cardiovascular status: blood pressure returned to baseline and stable Postop Assessment: no apparent nausea or vomiting Anesthetic complications: no   No notable events documented.  Last Vitals:  Vitals:   04/12/22 1145 04/12/22 1200  BP: 102/77 108/74  Pulse: 62 61  Resp: 15 13  Temp:  36.6 C  SpO2: 95% 99%    Last Pain:  Vitals:   04/12/22 1200  TempSrc:   PainSc: 0-No pain                 Shelton Silvas

## 2022-04-12 NOTE — Discharge Instructions (Signed)
[facility] Office Phone Number [phone]  BREAST BIOPSY/ PARTIAL MASTECTOMY: POST OP INSTRUCTIONS  Always review your discharge instruction sheet given to you by the facility where your surgery was performed.  IF YOU HAVE DISABILITY OR FAMILY LEAVE FORMS, YOU MUST BRING THEM TO THE OFFICE FOR PROCESSING.  DO NOT GIVE THEM TO YOUR DOCTOR.  A prescription for pain medication may be given to you upon discharge.  Take your pain medication as prescribed, if needed.  If narcotic pain medicine is not needed, then you may take acetaminophen (Tylenol) or ibuprofen (Advil) as needed. Take your usually prescribed medications unless otherwise directed If you need a refill on your pain medication, please contact your pharmacy.  They will contact our office to request authorization.  Prescriptions will not be filled after 5pm or on week-ends. You should eat very light the first 24 hours after surgery, such as soup, crackers, pudding, etc.  Resume your normal diet the day after surgery. Most patients will experience some swelling and bruising in the breast.  Ice packs and a good support bra will help.  Swelling and bruising can take several days to resolve.  It is common to experience some constipation if taking pain medication after surgery.  Increasing fluid intake and taking a stool softener will usually help or prevent this problem from occurring.  A mild laxative (Milk of Magnesia or Miralax) should be taken according to package directions if there are no bowel movements after 48 hours. Unless discharge instructions indicate otherwise, you may remove your bandages 24-48 hours after surgery, and you may shower at that time.  You may have steri-strips (small skin tapes) in place directly over the incision.  These strips should be left on the skin for 7-10 days.  If your surgeon used skin glue on the incision, you may shower in 24 hours.  The glue will flake off over the next 2-3 weeks.  Any  sutures or staples will be removed at the office during your follow-up visit. ACTIVITIES:  You may resume regular daily activities (gradually increasing) beginning the next day.  Wearing a good support bra or sports bra minimizes pain and swelling.  You may have sexual intercourse when it is comfortable. You may drive when you no longer are taking prescription pain medication, you can comfortably wear a seatbelt, and you can safely maneuver your car and apply brakes. RETURN TO WORK:  ______________________________________________________________________________________ Monica Christensen should see your doctor in the office for a follow-up appointment approximately two weeks after your surgery.  Your doctor's nurse will typically make your follow-up appointment when she calls you with your pathology report.  Expect your pathology report 2-3 business days after your surgery.  You may call to check if you do not hear from [place] after three days. OTHER INSTRUCTIONS: _______________________________________________________________________________________________ _____________________________________________________________________________________________________________________________________ _____________________________________________________________________________________________________________________________________ _____________________________________________________________________________________________________________________________________  WHEN TO CALL YOUR DOCTOR: Fever over 101.0 Nausea and/or vomiting. Extreme swelling or bruising. Continued bleeding from incision. Increased pain, redness, or drainage from the incision.  The clinic staff is available to answer your questions during regular business hours.  Please don't hesitate to call and ask to speak to one of the nurses for clinical concerns.  If you have a medical emergency, go to the nearest emergency room or call 911.  A surgeon from [facility] is always on call at the hospital.  For further questions, please visit [email or web address] [employer] Office Phone Number [phone]  BREAST BIOPSY/ PARTIAL MASTECTOMY: POST OP INSTRUCTIONS  Always review  your discharge instruction sheet given to you by the facility where your surgery was performed.  IF YOU HAVE DISABILITY OR FAMILY LEAVE FORMS, YOU MUST BRING THEM TO THE OFFICE FOR PROCESSING.  DO NOT GIVE THEM TO YOUR DOCTOR.  A prescription for pain medication may be given to you upon discharge.  Take your pain medication as prescribed, if needed.  If narcotic pain medicine is not needed, then you may take acetaminophen (Tylenol) or ibuprofen (Advil) as needed. Take your usually prescribed medications unless otherwise directed If you need a refill on your pain medication, please contact your pharmacy.  They will contact our office to request authorization.  Prescriptions will not be filled after 5pm or on week-ends. You should eat very light the first 24 hours after surgery, such as soup, crackers, pudding, etc.  Resume your normal diet the day after surgery. Most patients will experience some swelling and bruising in the breast.  Ice packs and a good support bra will help.  Swelling and bruising can take several days to resolve.  It is common to experience some constipation if taking pain medication after surgery.  Increasing fluid intake and taking a stool softener will usually help or prevent this problem from occurring.  A mild laxative (Milk of Magnesia or Miralax) should be taken according to package directions if there are no bowel movements after 48 hours. Unless discharge instructions indicate otherwise, you may remove your bandages 24-48 hours after surgery, and you may shower at that time.  You may have steri-strips (small skin tapes) in place directly over the incision.  These strips should be left on the skin for 7-10 days.  If your surgeon  used skin glue on the incision, you may shower in 24 hours.  The glue will flake off over the next 2-3 weeks.  Any sutures or staples will be removed at the office during your follow-up visit. ACTIVITIES:  You may resume regular daily activities (gradually increasing) beginning the next day.  Wearing a good support bra or sports bra minimizes pain and swelling.  You may have sexual intercourse when it is comfortable. You may drive when you no longer are taking prescription pain medication, you can comfortably wear a seatbelt, and you can safely maneuver your car and apply brakes. RETURN TO WORK:  ______________________________________________________________________________________ Monica Christensen should see your doctor in the office for a follow-up appointment approximately two weeks after your surgery.  Your doctor's nurse will typically make your follow-up appointment when she calls you with your pathology report.  Expect your pathology report 2-3 business days after your surgery.  You may call to check if you do not hear from [place] after three days. OTHER INSTRUCTIONS: YOU MAY SHOWER STARTING TOMORROW ICE PACK, TYLENOL, AND IBUPROFEN ALSO FOR PAIN NO VIGOROUS ACTIVITY FOR ONE WEEK _______________________________________________________________________________________________ _____________________________________________________________________________________________________________________________________ _____________________________________________________________________________________________________________________________________ _____________________________________________________________________________________________________________________________________  WHEN TO CALL YOUR DOCTOR: Fever over 101.0 Nausea and/or vomiting. Extreme swelling or bruising. Continued bleeding from incision. Increased pain, redness, or drainage from the incision.  The clinic staff is available to answer your questions  during regular business hours.  Please don't hesitate to call and ask to speak to one of the nurses for clinical concerns.  If you have a medical emergency, go to the nearest emergency room or call 911.  A surgeon from [facility] is always on call at the hospital.  For further questions, please visit [email or web address]

## 2022-04-12 NOTE — Anesthesia Preprocedure Evaluation (Addendum)
Anesthesia Evaluation  Patient identified by MRN, date of birth, ID band Patient awake    Reviewed: Allergy & Precautions, NPO status , Patient's Chart, lab work & pertinent test results  Airway Mallampati: II  TM Distance: >3 FB Neck ROM: Full    Dental  (+) Teeth Intact, Dental Advisory Given   Pulmonary    breath sounds clear to auscultation       Cardiovascular hypertension, Pt. on medications  Rhythm:Regular Rate:Normal     Neuro/Psych negative neurological ROS  negative psych ROS   GI/Hepatic negative GI ROS, Neg liver ROS,,,  Endo/Other  negative endocrine ROS    Renal/GU negative Renal ROS     Musculoskeletal negative musculoskeletal ROS (+)    Abdominal   Peds  Hematology negative hematology ROS (+)   Anesthesia Other Findings   Reproductive/Obstetrics                             Anesthesia Physical Anesthesia Plan  ASA: 2  Anesthesia Plan: General   Post-op Pain Management: Tylenol PO (pre-op)*   Induction: Intravenous  PONV Risk Score and Plan: 4 or greater and Ondansetron, Dexamethasone, Midazolam and Scopolamine patch - Pre-op  Airway Management Planned: LMA  Additional Equipment: None  Intra-op Plan:   Post-operative Plan: Extubation in OR  Informed Consent: I have reviewed the patients History and Physical, chart, labs and discussed the procedure including the risks, benefits and alternatives for the proposed anesthesia with the patient or authorized representative who has indicated his/her understanding and acceptance.     Dental advisory given  Plan Discussed with: CRNA  Anesthesia Plan Comments:        Anesthesia Quick Evaluation

## 2022-04-12 NOTE — Anesthesia Procedure Notes (Signed)
Procedure Name: LMA Insertion Date/Time: 04/12/2022 10:21 AM  Performed by: Randon Goldsmith, CRNAPre-anesthesia Checklist: Patient identified, Emergency Drugs available, Suction available and Patient being monitored Patient Re-evaluated:Patient Re-evaluated prior to induction Oxygen Delivery Method: Circle system utilized Preoxygenation: Pre-oxygenation with 100% oxygen Induction Type: IV induction Ventilation: Mask ventilation without difficulty LMA: LMA inserted LMA Size: 4.0 Tube type: Oral Number of attempts: 1 Airway Equipment and Method: Bite block Placement Confirmation: positive ETCO2 and breath sounds checked- equal and bilateral Tube secured with: Tape Dental Injury: Teeth and Oropharynx as per pre-operative assessment

## 2022-04-12 NOTE — Interval H&P Note (Signed)
History and Physical Interval Note: no change in H and P  04/12/2022 9:30 AM  Monica Christensen  has presented today for surgery, with the diagnosis of RIGHT BREAST COMPLEX SCLEROSING LESION.  The various methods of treatment have been discussed with the patient and family. After consideration of risks, benefits and other options for treatment, the patient has consented to  Procedure(s): RIGHT BREAST LUMPECTOMY WITH RADIOACTIVE SEED LOCALIZATION (Right) as a surgical intervention.  The patient's history has been reviewed, patient examined, no change in status, stable for surgery.  I have reviewed the patient's chart and labs.  Questions were answered to the patient's satisfaction.     Abigail Miyamoto

## 2022-04-12 NOTE — Op Note (Signed)
RIGHT BREAST LUMPECTOMY WITH RADIOACTIVE SEED LOCALIZATION  Procedure Note  Monica Christensen 04/12/2022   Pre-op Diagnosis: RIGHT BREAST COMPLEX SCLEROSING LESION     Post-op Diagnosis: same  Procedure(s): RIGHT BREAST LUMPECTOMY WITH RADIOACTIVE SEED LOCALIZATION  Surgeon(s): Abigail Miyamoto, MD  Anesthesia: General  Staff:  Circulator: Audie Pinto D, RN Scrub Person: Cecile Sheerer  Estimated Blood Loss: Minimal               Specimens: sent to path  Indications: This is a 51 year old female with a family history of breast cancer who underwent screening mammography which showed a distortion in the right breast.  She underwent a biopsy showing a complex sclerosing lesion.  The decision was made to proceed with a radioactive seed guided  Procedure: The patient was brought to the operating identified the correct patient.  She was placed supine on the operating room table and general anesthesia was induced.  Her left breast was prepped and draped in usual sterile fashion.  Using the neoprobe, the radioactive seed was located in the upper outer quadrant of the right breast slightly more than 5 cm from the nipple areolar complex.  I anesthetized the lateral edge of the areola with Marcaine and then made a circumareolar incision with a scalpel.  I then dissected down to the breast tissue and then laterally toward the radioactive seed.  Once I reached the area of the seed I then dissected down to the deeper breast tissue with the seed was located.  I dissected widely around the area of increased uptake with the neoprobe.  There was some dilated ducts in the area.  When I grabbed the deeper tissue the radioactive seed came out of the specimen.  I placed this in a cup which was x-rayed separately.  I then completed the lumpectomy with electrocautery.  I marked all margins with pain.  An x-ray was performed confirming that the previous tissue biopsy clip was in the specimen.  This was sent to  pathology for evaluation.  I then took more posterior margin with the cautery and sent this separately.  We then anesthetized the incision further with Marcaine.  Hemostasis appeared to be achieved.  We then closed the subcutaneous tissue with interrupted 3-0 Vicryl sutures and closed the skin with a running 4-0 Monocryl.  Dermabond was then applied.  The patient tolerated the procedure well.  All the counts were correct at the end of the procedure.  She was then extubated in the operating room and taken in a stable condition to the recovery room.          Abigail Miyamoto   Date: 04/12/2022  Time: 10:58 AM

## 2022-04-12 NOTE — Transfer of Care (Signed)
Immediate Anesthesia Transfer of Care Note  Patient: Monica Christensen  Procedure(s) Performed: RIGHT BREAST LUMPECTOMY WITH RADIOACTIVE SEED LOCALIZATION (Right: Breast)  Patient Location: PACU  Anesthesia Type:General  Level of Consciousness: drowsy  Airway & Oxygen Therapy: Patient Spontanous Breathing and Patient connected to face mask oxygen  Post-op Assessment: Report given to RN, Post -op Vital signs reviewed and stable, and Patient moving all extremities X 4  Post vital signs: Reviewed and stable  Last Vitals:  Vitals Value Taken Time  BP 100/72 04/12/22 1121  Temp    Pulse 74 04/12/22 1124  Resp 27 04/12/22 1124  SpO2 96 % 04/12/22 1124  Vitals shown include unvalidated device data.  Last Pain:  Vitals:   04/12/22 0902  TempSrc:   PainSc: 0-No pain         Complications: No notable events documented.

## 2022-04-13 ENCOUNTER — Encounter (HOSPITAL_COMMUNITY): Payer: Self-pay | Admitting: Surgery

## 2022-04-14 LAB — SURGICAL PATHOLOGY

## 2022-05-18 ENCOUNTER — Encounter (HOSPITAL_COMMUNITY): Payer: Self-pay

## 2023-09-21 ENCOUNTER — Other Ambulatory Visit: Payer: Self-pay | Admitting: *Deleted

## 2023-09-21 DIAGNOSIS — I8393 Asymptomatic varicose veins of bilateral lower extremities: Secondary | ICD-10-CM

## 2023-10-05 ENCOUNTER — Encounter

## 2023-10-05 ENCOUNTER — Ambulatory Visit (HOSPITAL_COMMUNITY)
Admission: RE | Admit: 2023-10-05 | Discharge: 2023-10-05 | Disposition: A | Discharge status: Home / Self Care | Source: Ambulatory Visit | Attending: Vascular Surgery | Admitting: Vascular Surgery

## 2023-10-05 DIAGNOSIS — I8393 Asymptomatic varicose veins of bilateral lower extremities: Secondary | ICD-10-CM | POA: Diagnosis present

## 2023-10-11 ENCOUNTER — Ambulatory Visit: Attending: Vascular Surgery | Admitting: Physician Assistant

## 2023-10-11 VITALS — BP 108/75 | HR 60 | Temp 98.4°F | Ht 66.0 in | Wt 215.0 lb

## 2023-10-11 DIAGNOSIS — R600 Localized edema: Secondary | ICD-10-CM | POA: Diagnosis not present

## 2023-10-11 DIAGNOSIS — I872 Venous insufficiency (chronic) (peripheral): Secondary | ICD-10-CM | POA: Diagnosis not present

## 2023-10-19 NOTE — Progress Notes (Signed)
 Requested by:  Dianah Fort, PA [address] [place],  [place] [address]  Reason for consultation: Left lower leg swelling   History of Present Illness   Monica Christensen is a 53 y.o. (1971-02-05) female who presents for evaluation of left lower extremity edema.  She says over the past year she has been developing worsening left lower leg and ankle swelling.  Her leg is typically normal in the mornings and then gets significantly more swollen after the end of the workday.  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  Her leg swelling causes her significant aching, heaviness, and tiredness.  She elevates her legs in between clients as much as she can.  She says in the winter she wears knee-high compression stockings which only mildly helps with her swelling.  She denies any prior history of DVT or venous ulcerations.  Past Medical History:  Diagnosis Date   Hypertension    Pneumonia    15 years ago per pt   pulmonary embolism 02/02/2010   RLL PE in settin of birth control pills   Seasonal allergies    Sinusitis     Past Surgical History:  Procedure Laterality Date   BREAST LUMPECTOMY WITH RADIOACTIVE SEED LOCALIZATION Right 04/12/2022   Procedure: RIGHT BREAST LUMPECTOMY WITH RADIOACTIVE SEED LOCALIZATION;  Surgeon: Oza Blumenthal, MD;  Location: [facility] OR;  Service: General;  Laterality: Right;   CESAREAN SECTION  2001, 1998   ENDOMETRIAL ABLATION W/ NOVASURE  08/2010   TUBAL LIGATION      Social History   Socioeconomic History   Marital status: Married    Spouse name: Not on file   Number of children: Not on file   Years of education: Not on file   Highest education level: Not on file  Occupational History   Occupation: Presenter, broadcasting: [employer]  Tobacco Use   Smoking status: Never   Smokeless tobacco: Never  Vaping Use   Vaping status: Never Used  Substance and Sexual Activity   Alcohol use: No   Drug use: No   Sexual  activity: Yes    Birth control/protection: Surgical  Other Topics Concern   Not on file  Social History Narrative   Not on file   Social Drivers of Health   Financial Resource Strain: Not on file  Food Insecurity: Not on file  Transportation Needs: Not on file  Physical Activity: Not on file  Stress: Not on file  Social Connections: Not on file  Intimate Partner Violence: Not on file    Family History  Problem Relation Age of Onset   Cancer Mother    Hypertension Mother    Breast cancer Mother    Coronary artery disease Father    Hypertension Father     Current Outpatient Medications  Medication Sig Dispense Refill   hydrochlorothiazide (HYDRODIURIL) 25 MG tablet Take 25 mg by mouth daily.     Multiple Vitamin (MULTIVITAMIN) tablet Take 1 tablet by mouth daily.     traMADol  (ULTRAM ) 50 MG tablet Take 1-2 tablets (50-100 mg total) by mouth every 6 (six) hours as needed for moderate pain or severe pain. 30 tablet 0   No current facility-administered medications for this visit.    Allergies  Allergen Reactions   Penicillins Rash   Percocet [Oxycodone-Acetaminophen ] Itching    REVIEW OF SYSTEMS (negative unless checked):   Cardiac:  []  Chest pain or chest pressure? []   Shortness of breath upon activity? []  Shortness of breath when lying flat? []  Irregular heart rhythm?  Vascular:  []  Pain in calf, thigh, or hip brought on by walking? []  Pain in feet at night that wakes you up from your sleep? []  Blood clot in your veins? [x]  Leg swelling?  Pulmonary:  []  Oxygen at home? []  Productive cough? []  Wheezing?  Neurologic:  []  Sudden weakness in arms or legs? []  Sudden numbness in arms or legs? []  Sudden onset of difficult speaking or slurred speech? []  Temporary loss of vision in one eye? []  Problems with dizziness?  Gastrointestinal:  []  Blood in stool? []  Vomited blood?  Genitourinary:  []  Burning when urinating? []  Blood in urine?  Psychiatric:  []   Major depression  Hematologic:  []  Bleeding problems? []  Problems with blood clotting?  Dermatologic:  []  Rashes or ulcers?  Constitutional:  []  Fever or chills?  Ear/Nose/Throat:  []  Change in hearing? []  Nose bleeds? []  Sore throat?  Musculoskeletal:  []  Back pain? []  Joint pain? []  Muscle pain?   Physical Examination     Vitals:   10/11/23 0907  BP: 108/75  Pulse: 60  Temp: 98.4 F (36.9 C)  TempSrc: Temporal  SpO2: 95%  Weight: 215 lb (97.5 kg)  Height: 5' 6 (1.676 m)   Body mass index is 34.7 kg/m.  General:  WDWN in NAD; vital signs documented above Gait: Not observed HENT: WNL, normocephalic Pulmonary: normal non-labored breathing , without Rales, rhonchi,  wheezing Cardiac: regular Abdomen: soft, NT, no masses Skin: without rashes Vascular Exam/Pulses: Palpable pedal pulses Extremities: without varicose veins, with reticular veins, with edema, without stasis pigmentation, without lipodermatosclerosis, without ulcers Musculoskeletal: no muscle wasting or atrophy  Neurologic: A&O X 3;  No focal weakness or paresthesias are detected Psychiatric:  The pt has Normal affect.  Non-invasive Vascular Imaging   LLE Venous Insufficiency Duplex (10/05/2023):  --------------+---------+------+-----------+------------+--------+  LEFT         Reflux NoRefluxReflux TimeDiameter cmsComments                          Yes                                   +--------------+---------+------+-----------+------------+--------+  CFV          no                                              +--------------+---------+------+-----------+------------+--------+  FV mid        no                                              +--------------+---------+------+-----------+------------+--------+  Popliteal    no                                              +--------------+---------+------+-----------+------------+--------+  GSV at SFJ               yes    >500 ms      0.51              +--------------+---------+------+-----------+------------+--------+  GSV prox thigh          yes    >500 ms      0.43              +--------------+---------+------+-----------+------------+--------+  GSV mid thigh           yes    >500 ms      0.34              +--------------+---------+------+-----------+------------+--------+  GSV dist thigh          yes    >500 ms      0.42              +--------------+---------+------+-----------+------------+--------+  GSV at knee             yes    >500 ms      0.43              +--------------+---------+------+-----------+------------+--------+  GSV prox calf           yes    >500 ms      0.41              +--------------+---------+------+-----------+------------+--------+  GSV mid calf            yes    >500 ms      0.34              +--------------+---------+------+-----------+------------+--------+  SSV Pop Fossa no                            0.29              +--------------+---------+------+-----------+------------+--------+  SSV prox calf no                            0.35              +--------------+---------+------+-----------+------------+--------+  SSV mid calf  no                            0.42              +--------------+---------+------+-----------+------------+--------+  AASV O                  yes    >500 ms      0.40    branches  +--------------+---------+------+-----------+------------+--------+               no                            0.29              +--------------+---------+------+-----------+------------+--------+    Medical Decision Making   Monica Christensen is a 53 y.o. female who presents for evaluation of left leg swelling  Based on the patient's duplex, there is reflux in the left greater saphenous vein from the saphenofemoral junction to the mid calf.  There is also reflux in the anterior accessory  saphenous vein.  The greater saphenous vein is nearly 4 mm or greater throughout the entire thigh and ultimately could be a good future candidate for ablation The patient reports several months of worsening left lower leg and ankle swelling.  She says that her left leg is fairly normal in the mornings but after prolonged periods of standing for work her left lower leg becomes  very swollen.  This causes her leg to feel very heavy, achy, and uncomfortable. She works as a Interior and spatial designer.  When she is at work in between clients, she tries to elevate her legs is much as she can to help with her swelling.  During the winter she also wears knee-high compression stockings, which is mildly helpful to her symptoms. On exam she has 2+ left lower leg and ankle edema.  She has palpable pedal pulses I have discussed with the patient that her duplex does demonstrate significant reflux in the left greater saphenous vein.  This is likely the source of her lower extremity swelling.  I have encouraged her to continue conservative therapy including avoiding prolonged sitting and standing, elevating her legs above her heart when possible, and wearing compression stockings daily.  She is also very interested in discussing possible greater saphenous vein ablation in the future.  At this time she wants to continue wearing knee-high compression stockings and revisit her treatment options with one of our MDs in 6 months. She can follow-up with Dr. Charlotte Cookey or Dr. Vikki Graves in 6 months for repeat venous insufficiency evaluation   Monica Mcmullen Lord Rod, PA-C [facility] Office: [phone]  Clinic MD: Rosalva Comber

## 2024-01-07 ENCOUNTER — Ambulatory Visit: Attending: Surgery | Admitting: Surgery
